# Patient Record
Sex: Female | Born: 1950 | Race: White | Hispanic: No | Marital: Married | Smoking: Former smoker
Health system: Southern US, Community
[De-identification: ages and names within clinical notes are randomized; demographics above are authoritative.]

## PROBLEM LIST (undated history)

## (undated) DIAGNOSIS — E7800 Pure hypercholesterolemia, unspecified: Secondary | ICD-10-CM

## (undated) DIAGNOSIS — T4145XA Adverse effect of unspecified anesthetic, initial encounter: Secondary | ICD-10-CM

## (undated) DIAGNOSIS — T8859XA Other complications of anesthesia, initial encounter: Secondary | ICD-10-CM

## (undated) DIAGNOSIS — L719 Rosacea, unspecified: Secondary | ICD-10-CM

## (undated) DIAGNOSIS — R51 Headache: Secondary | ICD-10-CM

## (undated) DIAGNOSIS — I10 Essential (primary) hypertension: Secondary | ICD-10-CM

## (undated) DIAGNOSIS — K219 Gastro-esophageal reflux disease without esophagitis: Secondary | ICD-10-CM

## (undated) DIAGNOSIS — R519 Headache, unspecified: Secondary | ICD-10-CM

## (undated) HISTORY — PX: WISDOM TOOTH EXTRACTION: SHX21

## (undated) HISTORY — PX: ABDOMINAL HYSTERECTOMY: SHX81

## (undated) HISTORY — PX: COLONOSCOPY: SHX174

---
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
## 2005-01-15 ENCOUNTER — Ambulatory Visit: Payer: Self-pay | Admitting: Obstetrics and Gynecology

## 2006-01-17 ENCOUNTER — Ambulatory Visit: Payer: Self-pay | Admitting: Obstetrics and Gynecology

## 2007-01-20 ENCOUNTER — Ambulatory Visit: Payer: Self-pay | Admitting: Obstetrics and Gynecology

## 2007-02-11 ENCOUNTER — Ambulatory Visit: Payer: Self-pay | Admitting: Gastroenterology

## 2008-01-26 ENCOUNTER — Ambulatory Visit: Payer: Self-pay | Admitting: Obstetrics and Gynecology

## 2009-01-27 ENCOUNTER — Ambulatory Visit: Payer: Self-pay | Admitting: Obstetrics and Gynecology

## 2010-02-21 ENCOUNTER — Ambulatory Visit: Payer: Self-pay | Admitting: Obstetrics and Gynecology

## 2010-03-08 ENCOUNTER — Ambulatory Visit: Payer: Self-pay | Admitting: Obstetrics and Gynecology

## 2011-03-20 ENCOUNTER — Ambulatory Visit: Payer: Self-pay | Admitting: Obstetrics and Gynecology

## 2012-04-08 ENCOUNTER — Ambulatory Visit: Payer: Self-pay | Admitting: Obstetrics and Gynecology

## 2013-05-20 ENCOUNTER — Ambulatory Visit: Payer: Self-pay | Admitting: Obstetrics and Gynecology

## 2013-10-27 DIAGNOSIS — E559 Vitamin D deficiency, unspecified: Secondary | ICD-10-CM | POA: Insufficient documentation

## 2013-10-27 DIAGNOSIS — K219 Gastro-esophageal reflux disease without esophagitis: Secondary | ICD-10-CM | POA: Insufficient documentation

## 2014-01-11 DIAGNOSIS — E785 Hyperlipidemia, unspecified: Secondary | ICD-10-CM | POA: Insufficient documentation

## 2014-01-11 DIAGNOSIS — L719 Rosacea, unspecified: Secondary | ICD-10-CM | POA: Insufficient documentation

## 2014-01-11 DIAGNOSIS — I10 Essential (primary) hypertension: Secondary | ICD-10-CM | POA: Insufficient documentation

## 2014-01-11 DIAGNOSIS — G43909 Migraine, unspecified, not intractable, without status migrainosus: Secondary | ICD-10-CM | POA: Insufficient documentation

## 2014-06-02 ENCOUNTER — Ambulatory Visit: Payer: Self-pay | Admitting: Obstetrics and Gynecology

## 2014-06-21 ENCOUNTER — Ambulatory Visit: Payer: Self-pay | Admitting: Obstetrics and Gynecology

## 2015-06-09 ENCOUNTER — Other Ambulatory Visit: Payer: Self-pay | Admitting: Obstetrics and Gynecology

## 2015-06-09 DIAGNOSIS — Z1231 Encounter for screening mammogram for malignant neoplasm of breast: Secondary | ICD-10-CM

## 2015-06-14 ENCOUNTER — Ambulatory Visit
Admission: RE | Admit: 2015-06-14 | Discharge: 2015-06-14 | Disposition: A | Discharge status: Home / Self Care | Payer: BC Managed Care – PPO | Source: Ambulatory Visit | Attending: Obstetrics and Gynecology | Admitting: Obstetrics and Gynecology

## 2015-06-14 DIAGNOSIS — Z1231 Encounter for screening mammogram for malignant neoplasm of breast: Secondary | ICD-10-CM | POA: Diagnosis present

## 2015-06-17 ENCOUNTER — Other Ambulatory Visit: Payer: Self-pay | Admitting: Obstetrics and Gynecology

## 2015-06-17 DIAGNOSIS — N63 Unspecified lump in unspecified breast: Secondary | ICD-10-CM

## 2015-06-24 ENCOUNTER — Ambulatory Visit
Admission: RE | Admit: 2015-06-24 | Discharge: 2015-06-24 | Disposition: A | Discharge status: Home / Self Care | Payer: BC Managed Care – PPO | Source: Ambulatory Visit | Attending: Obstetrics and Gynecology | Admitting: Obstetrics and Gynecology

## 2015-06-24 DIAGNOSIS — R928 Other abnormal and inconclusive findings on diagnostic imaging of breast: Secondary | ICD-10-CM | POA: Diagnosis not present

## 2015-06-24 DIAGNOSIS — N63 Unspecified lump in unspecified breast: Secondary | ICD-10-CM

## 2015-08-06 ENCOUNTER — Encounter: Payer: Self-pay | Admitting: Emergency Medicine

## 2015-08-06 DIAGNOSIS — I10 Essential (primary) hypertension: Secondary | ICD-10-CM | POA: Insufficient documentation

## 2015-08-06 DIAGNOSIS — R109 Unspecified abdominal pain: Secondary | ICD-10-CM | POA: Insufficient documentation

## 2015-08-06 DIAGNOSIS — R112 Nausea with vomiting, unspecified: Secondary | ICD-10-CM | POA: Diagnosis not present

## 2015-08-06 NOTE — ED Notes (Addendum)
Pt presents to ED with flu-like symptoms of chills, body aches, headache for the past week. Pt states she started to feel a little better on Friday but today worsened today with headache, vomiting X2 and "bladder pain". Pt states she has been trying to drink fluids all day but is not improving. Pt alert and calm at this time with no increased work of breathing or acute distress noted. Last time vomiting was around 2200.

## 2015-08-07 ENCOUNTER — Emergency Department
Admission: EM | Admit: 2015-08-07 | Discharge: 2015-08-07 | Disposition: A | Discharge status: Home / Self Care | Payer: BC Managed Care – PPO | Attending: Emergency Medicine | Admitting: Emergency Medicine

## 2015-08-07 DIAGNOSIS — R112 Nausea with vomiting, unspecified: Secondary | ICD-10-CM

## 2015-08-07 HISTORY — DX: Essential (primary) hypertension: I10

## 2015-08-07 LAB — COMPREHENSIVE METABOLIC PANEL
ALT: 23 U/L (ref 14–54)
AST: 27 U/L (ref 15–41)
Albumin: 4.1 g/dL (ref 3.5–5.0)
Alkaline Phosphatase: 95 U/L (ref 38–126)
Anion gap: 6 (ref 5–15)
BILIRUBIN TOTAL: 0.5 mg/dL (ref 0.3–1.2)
BUN: 12 mg/dL (ref 6–20)
CHLORIDE: 102 mmol/L (ref 101–111)
CO2: 30 mmol/L (ref 22–32)
Calcium: 9.3 mg/dL (ref 8.9–10.3)
Creatinine, Ser: 0.67 mg/dL (ref 0.44–1.00)
Glucose, Bld: 131 mg/dL — ABNORMAL HIGH (ref 65–99)
POTASSIUM: 4.2 mmol/L (ref 3.5–5.1)
Sodium: 138 mmol/L (ref 135–145)
TOTAL PROTEIN: 7.7 g/dL (ref 6.5–8.1)

## 2015-08-07 LAB — URINALYSIS COMPLETE WITH MICROSCOPIC (ARMC ONLY)
Bilirubin Urine: NEGATIVE
GLUCOSE, UA: NEGATIVE mg/dL
HGB URINE DIPSTICK: NEGATIVE
LEUKOCYTES UA: NEGATIVE
NITRITE: NEGATIVE
Protein, ur: NEGATIVE mg/dL
SPECIFIC GRAVITY, URINE: 1.017 (ref 1.005–1.030)
pH: 5 (ref 5.0–8.0)

## 2015-08-07 LAB — CBC
HEMATOCRIT: 42.8 % (ref 35.0–47.0)
Hemoglobin: 14.5 g/dL (ref 12.0–16.0)
MCH: 29.5 pg (ref 26.0–34.0)
MCHC: 33.9 g/dL (ref 32.0–36.0)
MCV: 87.2 fL (ref 80.0–100.0)
PLATELETS: 230 10*3/uL (ref 150–440)
RBC: 4.91 MIL/uL (ref 3.80–5.20)
RDW: 13.7 % (ref 11.5–14.5)
WBC: 3.3 10*3/uL — AB (ref 3.6–11.0)

## 2015-08-07 MED ORDER — ONDANSETRON HCL 4 MG PO TABS: 4.0000 mg | ORAL_TABLET | Freq: Every day | ORAL | Status: DC | PRN

## 2015-08-07 NOTE — Discharge Instructions (Signed)
Please seek medical attention for any high fevers, chest pain, shortness of breath, change in behavior, persistent vomiting, bloody stool or any other new or concerning symptoms. ° ° °Nausea and Vomiting °Nausea is a sick feeling that often comes before throwing up (vomiting). Vomiting is a reflex where stomach contents come out of your mouth. Vomiting can cause severe loss of body fluids (dehydration). Children and elderly adults can become dehydrated quickly, especially if they also have diarrhea. Nausea and vomiting are symptoms of a condition or disease. It is important to find the cause of your symptoms. °CAUSES  °· Direct irritation of the stomach lining. This irritation can result from increased acid production (gastroesophageal reflux disease), infection, food poisoning, taking certain medicines (such as nonsteroidal anti-inflammatory drugs), alcohol use, or tobacco use. °· Signals from the brain. These signals could be caused by a headache, heat exposure, an inner ear disturbance, increased pressure in the brain from injury, infection, a tumor, or a concussion, pain, emotional stimulus, or metabolic problems. °· An obstruction in the gastrointestinal tract (bowel obstruction). °· Illnesses such as diabetes, hepatitis, gallbladder problems, appendicitis, kidney problems, cancer, sepsis, atypical symptoms of a heart attack, or eating disorders. °· Medical treatments such as chemotherapy and radiation. °· Receiving medicine that makes you sleep (general anesthetic) during surgery. °DIAGNOSIS °Your caregiver may ask for tests to be done if the problems do not improve after a few days. Tests may also be done if symptoms are severe or if the reason for the nausea and vomiting is not clear. Tests may include: °· Urine tests. °· Blood tests. °· Stool tests. °· Cultures (to look for evidence of infection). °· X-rays or other imaging studies. °Test results can help your caregiver make decisions about treatment or the  need for additional tests. °TREATMENT °You need to stay well hydrated. Drink frequently but in small amounts. You may wish to drink water, sports drinks, clear broth, or eat frozen ice pops or gelatin dessert to help stay hydrated. When you eat, eating slowly may help prevent nausea. There are also some antinausea medicines that may help prevent nausea. °HOME CARE INSTRUCTIONS  °· Take all medicine as directed by your caregiver. °· If you do not have an appetite, do not force yourself to eat. However, you must continue to drink fluids. °· If you have an appetite, eat a normal diet unless your caregiver tells you differently. °¨ Eat a variety of complex carbohydrates (rice, wheat, potatoes, bread), lean meats, yogurt, fruits, and vegetables. °¨ Avoid high-fat foods because they are more difficult to digest. °· Drink enough water and fluids to keep your urine clear or pale yellow. °· If you are dehydrated, ask your caregiver for specific rehydration instructions. Signs of dehydration may include: °¨ Severe thirst. °¨ Dry lips and mouth. °¨ Dizziness. °¨ Dark urine. °¨ Decreasing urine frequency and amount. °¨ Confusion. °¨ Rapid breathing or pulse. °SEEK IMMEDIATE MEDICAL CARE IF:  °· You have blood or brown flecks (like coffee grounds) in your vomit. °· You have black or bloody stools. °· You have a severe headache or stiff neck. °· You are confused. °· You have severe abdominal pain. °· You have chest pain or trouble breathing. °· You do not urinate at least once every 8 hours. °· You develop cold or clammy skin. °· You continue to vomit for longer than 24 to 48 hours. °· You have a fever. °MAKE SURE YOU:  °· Understand these instructions. °· Will watch your condition. °· Will get help right away   if you are not doing well or get worse. °  °This information is not intended to replace advice given to you by your health care provider. Make sure you discuss any questions you have with your health care provider. °    °Document Released: 04/09/2005 Document Revised: 07/02/2011 Document Reviewed: 09/06/2010 °Elsevier Interactive Patient Education ©2016 [employer] ° °

## 2015-08-07 NOTE — ED Notes (Signed)
Pt reports flu-like symptoms times one week. Pt reports her husband had the same symptoms the week prior. Pt c/o of muscle aches, fever, productive cough, nausea/vomiting/diarrhea, fatigue.

## 2015-08-07 NOTE — ED Provider Notes (Signed)
[facility] Emergency Department Provider Note    ____________________________________________  Time seen: ~0525  I have reviewed the triage vital signs and the nursing notes.   HISTORY  Chief Complaint Generalized Body Aches; Chills; Headache; Emesis; and Abdominal Pain   History limited by: Not Limited   HPI Christina Jordan is a 65 y.o. female who presented to the emergency department today because of concerns for body aches, vomiting and suprapubic pressure. The patient states that she had a flulike illness last week. She then felt better at the end of the week. She states today however she started feeling worse again. She describes nausea and vomiting. She states she has not been able to eat or keep anything down. She states she has had some associated diarrhea. She denies any blood in the vomit or diarrhea. In addition the patient states she has noticed suprapubic pressure. She denies any fevers today.    Past Medical History  Diagnosis Date  . Hypertension     There are no active problems to display for this patient.   Past Surgical History  Procedure Laterality Date  . Abdominal hysterectomy      No current outpatient prescriptions on file.  Allergies Review of patient's allergies indicates no known allergies.  Family History  Problem Relation Age of Onset  . Breast cancer Neg Hx     Social History Social History  Substance Use Topics  . Smoking status: Never Smoker   . Smokeless tobacco: None  . Alcohol Use: No    Review of Systems  Constitutional: Negative for fever. Cardiovascular: Negative for chest pain. Respiratory: Negative for shortness of breath. Gastrointestinal: Positive for suprapubic pain Neurological: Negative for headaches, focal weakness or numbness.   10-point ROS otherwise negative.  ____________________________________________   PHYSICAL EXAM:  VITAL SIGNS: ED Triage Vitals  Enc Vitals  Group     BP 08/06/15 2356 155/98 mmHg     Pulse Rate 08/06/15 2356 73     Resp 08/06/15 2356 18     Temp 08/06/15 2356 98.1 F (36.7 C)     Temp Source 08/06/15 2356 Oral     SpO2 08/06/15 2356 100 %     Weight 08/06/15 2356 190 lb (86.183 kg)     Height 08/06/15 2356 5\' 6"  (1.676 m)     Head Cir --      Peak Flow --      Pain Score 08/06/15 2358 4   Constitutional: Alert and oriented. Well appearing and in no distress. Eyes: Conjunctivae are normal. PERRL. Normal extraocular movements. ENT   Head: Normocephalic and atraumatic.   Nose: No congestion/rhinnorhea.   Mouth/Throat: Mucous membranes are moist.   Neck: No stridor. Hematological/Lymphatic/Immunilogical: No cervical lymphadenopathy. Cardiovascular: Normal rate, regular rhythm.  No murmurs, rubs, or gallops. Respiratory: Normal respiratory effort without tachypnea nor retractions. Breath sounds are clear and equal bilaterally. No wheezes/rales/rhonchi. Gastrointestinal: Soft and nontender. No distention.  Genitourinary: Deferred Musculoskeletal: Normal range of motion in all extremities. No joint effusions.  No lower extremity tenderness nor edema. Neurologic:  Normal speech and language. No gross focal neurologic deficits are appreciated.  Skin:  Skin is warm, dry and intact. No rash noted. Psychiatric: Mood and affect are normal. Speech and behavior are normal. Patient exhibits appropriate insight and judgment.  ____________________________________________    LABS (pertinent positives/negatives)  Labs Reviewed  COMPREHENSIVE METABOLIC PANEL - Abnormal; Notable for the following:    Glucose, Bld 131 (*)    All other  components within normal limits  CBC - Abnormal; Notable for the following:    WBC 3.3 (*)    All other components within normal limits  URINALYSIS COMPLETEWITH MICROSCOPIC ([facility] ONLY) - Abnormal; Notable for the following:    Color, Urine YELLOW (*)    APPearance CLEAR (*)    Ketones,  ur TRACE (*)    Bacteria, UA RARE (*)    Squamous Epithelial / LPF 0-5 (*)    All other components within normal limits     ____________________________________________   EKG  None  ____________________________________________    RADIOLOGY  None  ____________________________________________   PROCEDURES  Procedure(s) performed: None  Critical Care performed: No  ____________________________________________   INITIAL IMPRESSION / ASSESSMENT AND PLAN / ED COURSE  Pertinent labs & imaging results that were available during my care of the patient were reviewed by me and considered in my medical decision making (see chart for details).  Patient presented to the emergency department today with multiple viral type syndrome. Urine without any signs of infection. Blood work without any concerning findings. Discussed with patient and offered IV fluids. She however declined. Will discharge with antiemetics. Encouraged primary care follow-up  ____________________________________________   FINAL CLINICAL IMPRESSION(S) / ED DIAGNOSES  Final diagnoses:  Non-intractable vomiting with nausea, vomiting of unspecified type     Phineas Semen, MD 08/07/15 [phone]

## 2015-08-07 NOTE — ED Notes (Signed)
  Reviewed d/c instructions, follow-up care, and prescriptions with pt. Pt verbalized understanding 

## 2016-06-07 ENCOUNTER — Other Ambulatory Visit: Payer: Self-pay | Admitting: Obstetrics and Gynecology

## 2016-06-07 DIAGNOSIS — Z1231 Encounter for screening mammogram for malignant neoplasm of breast: Secondary | ICD-10-CM

## 2016-06-26 ENCOUNTER — Ambulatory Visit
Admission: RE | Admit: 2016-06-26 | Discharge: 2016-06-26 | Disposition: A | Discharge status: Home / Self Care | Payer: BC Managed Care – PPO | Source: Ambulatory Visit | Attending: Obstetrics and Gynecology | Admitting: Obstetrics and Gynecology

## 2016-06-26 ENCOUNTER — Other Ambulatory Visit: Payer: Self-pay | Admitting: Obstetrics and Gynecology

## 2016-06-26 DIAGNOSIS — Z1231 Encounter for screening mammogram for malignant neoplasm of breast: Secondary | ICD-10-CM | POA: Insufficient documentation

## 2016-12-29 ENCOUNTER — Ambulatory Visit
Admission: EM | Admit: 2016-12-29 | Discharge: 2016-12-29 | Disposition: A | Discharge status: Home / Self Care | Payer: BC Managed Care – PPO | Attending: Family Medicine | Admitting: Family Medicine

## 2016-12-29 DIAGNOSIS — J0100 Acute maxillary sinusitis, unspecified: Secondary | ICD-10-CM

## 2016-12-29 DIAGNOSIS — L739 Follicular disorder, unspecified: Secondary | ICD-10-CM | POA: Diagnosis not present

## 2016-12-29 HISTORY — DX: Pure hypercholesterolemia, unspecified: E78.00

## 2016-12-29 MED ORDER — MUPIROCIN 2 % EX OINT: 1.0000 "application " | TOPICAL_OINTMENT | Freq: Two times a day (BID) | CUTANEOUS | 0 refills | Status: DC

## 2016-12-29 MED ORDER — AMOXICILLIN-POT CLAVULANATE 875-125 MG PO TABS: 1.0000 | ORAL_TABLET | Freq: Two times a day (BID) | ORAL | 0 refills | Status: DC

## 2016-12-29 MED ORDER — FEXOFENADINE-PSEUDOEPHED ER 180-240 MG PO TB24: 1.0000 | ORAL_TABLET | Freq: Every day | ORAL | 0 refills | Status: DC

## 2016-12-29 NOTE — ED Provider Notes (Signed)
[facility]    CSN: [id number] Arrival date & time: 12/29/16  1025     History   Chief Complaint Chief Complaint  Patient presents with  . Facial Pain    HPI Christina Jordan is a 66 y.o. female.   Patient is here because of the right sinus congestion she also reports along with right sinus congestion pain over her right ear. She reports symptoms started on Monday as well as nasal congestion last night she started having more swelling around the right ear she did think she may have scratched herself Friday near the mandible curve and the skin is broken this swelling underneath the right ear as well and because of the swelling in the right ear her daughter insisted she come in to be seen. She does have a history of recurrent sinus infection. She also has history of elevated cholesterol and hypertension. Surgeries been abdominal hysterectomy. Habits she does not smoke anymore she stopped in 1999. No pertinent family medical history relevant to today's visit. There is a history of breast cancer family   The history is provided by the patient. No language interpreter was used.    Past Medical History:  Diagnosis Date  . High cholesterol   . Hypertension     There are no active problems to display for this patient.   Past Surgical History:  Procedure Laterality Date  . ABDOMINAL HYSTERECTOMY      OB History    No data available       Home Medications    Prior to Admission medications   Medication Sig Start Date End Date Taking? Authorizing Provider  atorvastatin (LIPITOR) 10 MG tablet Take 10 mg by mouth daily.   Yes [provider]  benazepril-hydrochlorthiazide (LOTENSIN HCT) 10-12.5 MG tablet Take 1 tablet by mouth daily.   Yes [provider]  lisinopril (PRINIVIL,ZESTRIL) 10 MG tablet Take 10 mg by mouth daily.   Yes [provider]  amoxicillin-clavulanate (AUGMENTIN) 875-125 MG tablet Take 1 tablet by mouth 2 (two) times  daily. 12/29/16   Hassan Rowan, MD  fexofenadine-pseudoephedrine (ALLEGRA-D ALLERGY & CONGESTION) 180-240 MG 24 hr tablet Take 1 tablet by mouth daily. 12/29/16   Hassan Rowan, MD  mupirocin ointment (BACTROBAN) 2 % Apply 1 application topically 2 (two) times daily. 12/29/16   Hassan Rowan, MD  ondansetron (ZOFRAN) 4 MG tablet Take 1 tablet (4 mg total) by mouth daily as needed for nausea or vomiting. 08/07/15   Phineas Semen, MD    Family History Family History  Problem Relation Age of Onset  . Breast cancer Neg Hx     Social History Social History  Substance Use Topics  . Smoking status: Former Smoker    Types: Cigarettes    Quit date: 05/02/1997  . Smokeless tobacco: Never Used  . Alcohol use No     Allergies   Patient has no known allergies.   Review of Systems Review of Systems  HENT: Positive for dental problem, facial swelling, sinus pain and sinus pressure. Negative for sore throat.   All other systems reviewed and are negative.    Physical Exam Triage Vital Signs ED Triage Vitals [12/29/16 1034]  Enc Vitals Group     BP      Pulse      Resp      Temp      Temp src      SpO2      Weight 205 lb (93 kg)  Height 5' 6.5" (1.689 m)     Head Circumference      Peak Flow      Pain Score 3     Pain Loc      Pain Edu?      Excl. in GC?    No data found.   Updated Vital Signs Ht 5' 6.5" (1.689 m)   Wt 205 lb (93 kg)   BMI 32.59 kg/m   Visual Acuity Right Eye Distance:   Left Eye Distance:   Bilateral Distance:    Right Eye Near:   Left Eye Near:    Bilateral Near:     Physical Exam  Constitutional: She is oriented to person, place, and time. She appears well-developed and well-nourished.  HENT:  Head: Normocephalic and atraumatic.    Right Ear: Hearing, tympanic membrane and ear canal normal.  Left Ear: Hearing, tympanic membrane, external ear and ear canal normal.  Ears:  Nose: Mucosal edema present. Right sinus exhibits maxillary sinus  tenderness. Right sinus exhibits no frontal sinus tenderness. Left sinus exhibits no maxillary sinus tenderness and no frontal sinus tenderness.  Mouth/Throat: Uvula is midline, oropharynx is clear and moist and mucous membranes are normal. No uvula swelling.    She has appears be a folliculitis 2 lesions in the left ear but is also swelling of the left gland in that area of outside by the ear and inside the mouth. There is tenderness over the right maxillary sinuses as well. Neck supple or marked right lymph nodes in the right  Eyes: Pupils are equal, round, and reactive to light. EOM are normal.  Neck: Normal range of motion. Neck supple.  Pulmonary/Chest: Effort normal.  Musculoskeletal: Normal range of motion.  Neurological: She is alert and oriented to person, place, and time. No cranial nerve deficit. Coordination normal.  Skin: Skin is warm. Rash noted. There is erythema.  Psychiatric: She has a normal mood and affect.  Vitals reviewed.    UC Treatments / Results  Labs (all labs ordered are listed, but only abnormal results are displayed) Labs Reviewed - No data to display  EKG  EKG Interpretation None       Radiology No results found.  Procedures Procedures (including critical care time)  Medications Ordered in UC Medications - No data to display   Initial Impression / Assessment and Plan / UC Course  I have reviewed the triage vital signs and the nursing notes.  Pertinent labs & imaging results that were available during my care of the patient were reviewed by me and considered in my medical decision making (see chart for details).   will use Bactroban for the folliculitis to cover for staph in case staff is present will place her on Augmentin 875 one tablet twice a day for the sinus infection and the Allegra-D 1 tablet a day for the nasal congestion follow-up PCP 1-2 weeks needed.  Final Clinical Impressions(s) / UC Diagnoses   Final diagnoses:  Acute  maxillary sinusitis, recurrence not specified  Folliculitis    New Prescriptions Discharge Medication List as of 12/29/2016 12:10 PM    START taking these medications   Details  amoxicillin-clavulanate (AUGMENTIN) 875-125 MG tablet Take 1 tablet by mouth 2 (two) times daily., Starting Sat 12/29/2016, Normal    fexofenadine-pseudoephedrine (ALLEGRA-D ALLERGY & CONGESTION) 180-240 MG 24 hr tablet Take 1 tablet by mouth daily., Starting Sat 12/29/2016, Normal    mupirocin ointment (BACTROBAN) 2 % Apply 1 application topically 2 (two) times daily.,  Starting Sat 12/29/2016, Normal       Note: This dictation was prepared with Dragon dictation along with smaller phrase technology. Any transcriptional errors that result from this process are unintentional. Controlled Substance Prescriptions [place] Controlled Substance Registry consulted? Not Applicable   Hassan Rowan, MD 12/29/16 1222

## 2016-12-29 NOTE — ED Triage Notes (Signed)
Pt with sinus congestion and teeth/facial pain the past few days. Right neck slightly swollen and area below her right ear scab where she was scratching. Drainage from right ear this a.m. Pain 3/10

## 2017-06-20 ENCOUNTER — Other Ambulatory Visit: Payer: Self-pay | Admitting: Obstetrics and Gynecology

## 2017-06-20 DIAGNOSIS — Z1239 Encounter for other screening for malignant neoplasm of breast: Secondary | ICD-10-CM

## 2017-06-27 ENCOUNTER — Ambulatory Visit
Admission: RE | Admit: 2017-06-27 | Discharge: 2017-06-27 | Disposition: A | Discharge status: Home / Self Care | Payer: Medicare Other | Source: Ambulatory Visit | Attending: Obstetrics and Gynecology | Admitting: Obstetrics and Gynecology

## 2017-06-27 ENCOUNTER — Other Ambulatory Visit: Payer: Self-pay | Admitting: Obstetrics and Gynecology

## 2017-06-27 DIAGNOSIS — Z1239 Encounter for other screening for malignant neoplasm of breast: Secondary | ICD-10-CM

## 2017-06-27 DIAGNOSIS — Z1231 Encounter for screening mammogram for malignant neoplasm of breast: Secondary | ICD-10-CM | POA: Diagnosis not present

## 2017-07-15 ENCOUNTER — Other Ambulatory Visit: Payer: Self-pay

## 2017-07-15 ENCOUNTER — Ambulatory Visit
Admission: EM | Admit: 2017-07-15 | Discharge: 2017-07-15 | Disposition: A | Discharge status: Home / Self Care | Payer: Medicare Other | Attending: Family Medicine | Admitting: Family Medicine

## 2017-07-15 ENCOUNTER — Encounter: Payer: Self-pay | Admitting: Emergency Medicine

## 2017-07-15 DIAGNOSIS — J209 Acute bronchitis, unspecified: Secondary | ICD-10-CM | POA: Diagnosis not present

## 2017-07-15 MED ORDER — ALBUTEROL SULFATE HFA 108 (90 BASE) MCG/ACT IN AERS: 1.0000 | INHALATION_SPRAY | Freq: Four times a day (QID) | RESPIRATORY_TRACT | 0 refills | Status: DC | PRN

## 2017-07-15 MED ORDER — DOXYCYCLINE HYCLATE 100 MG PO CAPS: 100.0000 mg | ORAL_CAPSULE | Freq: Two times a day (BID) | ORAL | 0 refills | Status: DC

## 2017-07-15 MED ORDER — PREDNISONE 50 MG PO TABS: ORAL_TABLET | ORAL | 0 refills | Status: DC

## 2017-07-15 NOTE — ED Triage Notes (Signed)
Patient c/o cough, chest congestion for the past 3 weeks.

## 2017-07-15 NOTE — ED Provider Notes (Signed)
[facility]    CSN: [id number] Arrival date & time: 07/15/17  1129  History   Chief Complaint Chief Complaint  Patient presents with  . Cough   HPI  67 year old female presents with cough.  Patient reports a 3-week history of cough, and chest congestion.  She states that her cough is productive of discolored sputum.  She is also had some nasal congestion as well.  She is been taking NyQuil without improvement.  No recent fever.  No chills.  No shortness of breath.  She is a former smoker.  No known exacerbating factors.  No other associated symptoms.  No other complaints at this time.  Past Medical History:  Diagnosis Date  . High cholesterol   . Hypertension    Past Surgical History:  Procedure Laterality Date  . ABDOMINAL HYSTERECTOMY     OB History   None    Home Medications    Prior to Admission medications   Medication Sig Start Date End Date Taking? Authorizing Provider  atorvastatin (LIPITOR) 10 MG tablet Take 10 mg by mouth daily.   Yes [provider]  lisinopril (PRINIVIL,ZESTRIL) 10 MG tablet Take 10 mg by mouth daily.   Yes [provider]  albuterol (PROVENTIL HFA;VENTOLIN HFA) 108 (90 Base) MCG/ACT inhaler Inhale 1-2 puffs into the lungs every 6 (six) hours as needed for wheezing or shortness of breath. 07/15/17   Everlene Otherook, Zarina Pe G, DO  benazepril-hydrochlorthiazide (LOTENSIN HCT) 10-12.5 MG tablet Take 1 tablet by mouth daily.    [provider]  doxycycline (VIBRAMYCIN) 100 MG capsule Take 1 capsule (100 mg total) by mouth 2 (two) times daily. 07/15/17   Tommie Samsook, Kaysen Sefcik G, DO  fexofenadine-pseudoephedrine (ALLEGRA-D ALLERGY & CONGESTION) 180-240 MG 24 hr tablet Take 1 tablet by mouth daily. 12/29/16   Hassan RowanWade, Eugene, MD  mupirocin ointment (BACTROBAN) 2 % Apply 1 application topically 2 (two) times daily. 12/29/16   Hassan RowanWade, Eugene, MD  ondansetron (ZOFRAN) 4 MG tablet Take 1 tablet (4 mg total) by mouth daily as needed for nausea or  vomiting. 08/07/15   Phineas SemenGoodman, Graydon, MD  predniSONE (DELTASONE) 50 MG tablet 1 tablet daily x 5 days 07/15/17   Tommie Samsook, Fionna Merriott G, DO   Family History Family History  Problem Relation Age of Onset  . Breast cancer Neg Hx    Social History Social History   Tobacco Use  . Smoking status: Former Smoker    Types: Cigarettes    Last attempt to quit: 05/02/1997    Years since quitting: 20.2  . Smokeless tobacco: Never Used  Substance Use Topics  . Alcohol use: No  . Drug use: No   Allergies   Patient has no known allergies.   Review of Systems Review of Systems  Constitutional: Negative for fever.  HENT: Positive for congestion.   Respiratory: Positive for cough. Negative for shortness of breath.    Physical Exam Triage Vital Signs ED Triage Vitals  Enc Vitals Group     BP 07/15/17 1202 139/84     Pulse Rate 07/15/17 1202 81     Resp 07/15/17 1202 16     Temp 07/15/17 1202 98.8 F (37.1 C)     Temp Source 07/15/17 1202 Oral     SpO2 07/15/17 1202 100 %     Weight 07/15/17 1200 200 lb (90.7 kg)     Height 07/15/17 1200 5\' 7"  (1.702 m)     Head Circumference --      Peak  Flow --      Pain Score 07/15/17 1200 0     Pain Loc --      Pain Edu? --      Excl. in GC? --    Updated Vital Signs BP 139/84 (BP Location: Left Arm)   Pulse 81   Temp 98.8 F (37.1 C) (Oral)   Resp 16   Ht 5\' 7"  (1.702 m)   Wt 200 lb (90.7 kg)   SpO2 100%   BMI 31.32 kg/m   Physical Exam  Constitutional: She is oriented to person, place, and time. She appears well-developed. No distress.  HENT:  Head: Normocephalic and atraumatic.  Mouth/Throat: Oropharynx is clear and moist.  Cardiovascular: Normal rate and regular rhythm.  Pulmonary/Chest: Effort normal.  Diffuse coarse breath sounds and expiratory wheezing.  Neurological: She is alert and oriented to person, place, and time.  Skin: Skin is warm. No rash noted.  Psychiatric: She has a normal mood and affect. Her behavior is normal.    Nursing note and vitals reviewed.  UC Treatments / Results  Labs (all labs ordered are listed, but only abnormal results are displayed) Labs Reviewed - No data to display  EKG None Radiology No results found.  Procedures Procedures (including critical care time)  Medications Ordered in UC Medications - No data to display   Initial Impression / Assessment and Plan / UC Course  I have reviewed the triage vital signs and the nursing notes.  Pertinent labs & imaging results that were available during my care of the patient were reviewed by me and considered in my medical decision making (see chart for details).    67 year old female presents with acute bronchitis.  Treating with doxycycline, prednisone, albuterol.   Final Clinical Impressions(s) / UC Diagnoses   Final diagnoses:  Acute bronchitis, unspecified organism    ED Discharge Orders        Ordered    doxycycline (VIBRAMYCIN) 100 MG capsule  2 times daily     07/15/17 1311    predniSONE (DELTASONE) 50 MG tablet     07/15/17 1311    albuterol (PROVENTIL HFA;VENTOLIN HFA) 108 (90 Base) MCG/ACT inhaler  Every 6 hours PRN     07/15/17 1311     Controlled Substance Prescriptions [place] Controlled Substance Registry consulted? Not Applicable   Tommie Sams, DO 07/15/17 1320

## 2017-09-19 ENCOUNTER — Encounter: Payer: Self-pay | Admitting: *Deleted

## 2017-09-20 ENCOUNTER — Ambulatory Visit: Payer: Medicare Other | Admitting: Anesthesiology

## 2017-09-20 ENCOUNTER — Ambulatory Visit
Admission: RE | Admit: 2017-09-20 | Discharge: 2017-09-20 | Disposition: A | Discharge status: Home / Self Care | Payer: Medicare Other | Source: Ambulatory Visit | Attending: Unknown Physician Specialty | Admitting: Unknown Physician Specialty

## 2017-09-20 ENCOUNTER — Encounter
Admission: RE | Disposition: A | Discharge status: Home / Self Care | Payer: Self-pay | Source: Ambulatory Visit | Attending: Unknown Physician Specialty

## 2017-09-20 ENCOUNTER — Other Ambulatory Visit: Payer: Self-pay

## 2017-09-20 ENCOUNTER — Encounter: Payer: Self-pay | Admitting: *Deleted

## 2017-09-20 DIAGNOSIS — K640 First degree hemorrhoids: Secondary | ICD-10-CM | POA: Diagnosis not present

## 2017-09-20 DIAGNOSIS — Z87891 Personal history of nicotine dependence: Secondary | ICD-10-CM | POA: Insufficient documentation

## 2017-09-20 DIAGNOSIS — Z1211 Encounter for screening for malignant neoplasm of colon: Secondary | ICD-10-CM | POA: Diagnosis present

## 2017-09-20 DIAGNOSIS — Z79899 Other long term (current) drug therapy: Secondary | ICD-10-CM | POA: Diagnosis not present

## 2017-09-20 DIAGNOSIS — K5730 Diverticulosis of large intestine without perforation or abscess without bleeding: Secondary | ICD-10-CM | POA: Diagnosis not present

## 2017-09-20 DIAGNOSIS — I10 Essential (primary) hypertension: Secondary | ICD-10-CM | POA: Diagnosis not present

## 2017-09-20 DIAGNOSIS — E7800 Pure hypercholesterolemia, unspecified: Secondary | ICD-10-CM | POA: Diagnosis not present

## 2017-09-20 DIAGNOSIS — K219 Gastro-esophageal reflux disease without esophagitis: Secondary | ICD-10-CM | POA: Insufficient documentation

## 2017-09-20 DIAGNOSIS — Z800 Family history of malignant neoplasm of digestive organs: Secondary | ICD-10-CM | POA: Diagnosis not present

## 2017-09-20 HISTORY — DX: Headache: R51

## 2017-09-20 HISTORY — DX: Headache, unspecified: R51.9

## 2017-09-20 HISTORY — DX: Rosacea, unspecified: L71.9

## 2017-09-20 HISTORY — DX: Gastro-esophageal reflux disease without esophagitis: K21.9

## 2017-09-20 HISTORY — PX: COLONOSCOPY WITH PROPOFOL: SHX5780

## 2017-09-20 SURGERY — COLONOSCOPY WITH PROPOFOL
Anesthesia: General

## 2017-09-20 MED ORDER — SODIUM CHLORIDE 0.9 % IV SOLN
INTRAVENOUS | Status: DC
  Administered 2017-09-20: 10:00:00 via INTRAVENOUS

## 2017-09-20 MED ORDER — FENTANYL CITRATE (PF) 100 MCG/2ML IJ SOLN
INTRAMUSCULAR | Status: AC
  Filled 2017-09-20: qty 2

## 2017-09-20 MED ORDER — MIDAZOLAM HCL 2 MG/2ML IJ SOLN
INTRAMUSCULAR | Status: AC
  Filled 2017-09-20: qty 2

## 2017-09-20 MED ORDER — LIDOCAINE HCL (PF) 2 % IJ SOLN
INTRAMUSCULAR | Status: DC | PRN
  Administered 2017-09-20: 60 mg

## 2017-09-20 MED ORDER — PROPOFOL 500 MG/50ML IV EMUL
INTRAVENOUS | Status: DC | PRN
  Administered 2017-09-20: 50 ug/kg/min via INTRAVENOUS

## 2017-09-20 MED ORDER — PROPOFOL 500 MG/50ML IV EMUL
INTRAVENOUS | Status: AC
  Filled 2017-09-20: qty 50

## 2017-09-20 MED ORDER — PROPOFOL 10 MG/ML IV BOLUS
INTRAVENOUS | Status: DC | PRN
  Administered 2017-09-20 (×2): 20 mg via INTRAVENOUS

## 2017-09-20 MED ORDER — MIDAZOLAM HCL 5 MG/5ML IJ SOLN
INTRAMUSCULAR | Status: DC | PRN
  Administered 2017-09-20: 2 mg via INTRAVENOUS

## 2017-09-20 MED ORDER — SODIUM CHLORIDE 0.9 % IV SOLN: INTRAVENOUS | Status: DC

## 2017-09-20 MED ORDER — LIDOCAINE HCL (PF) 2 % IJ SOLN
INTRAMUSCULAR | Status: AC
  Filled 2017-09-20: qty 10

## 2017-09-20 MED ORDER — FENTANYL CITRATE (PF) 100 MCG/2ML IJ SOLN
INTRAMUSCULAR | Status: DC | PRN
  Administered 2017-09-20 (×2): 50 ug via INTRAVENOUS

## 2017-09-20 NOTE — Anesthesia Preprocedure Evaluation (Signed)
Anesthesia Evaluation  Patient identified by MRN, date of birth, ID band Patient awake    Reviewed: Allergy & Precautions, H&P , NPO status , Patient's Chart, lab work & pertinent test results  History of Anesthesia Complications Negative for: history of anesthetic complications  Airway Mallampati: III  TM Distance: <3 FB Neck ROM: limited    Dental  (+) Chipped, Poor Dentition, Missing   Pulmonary asthma , former smoker,           Cardiovascular Exercise Tolerance: Good hypertension, (-) angina(-) Past MI and (-) DOE      Neuro/Psych  Headaches, negative psych ROS   GI/Hepatic Neg liver ROS, GERD  Medicated and Controlled,  Endo/Other  negative endocrine ROS  Renal/GU negative Renal ROS  negative genitourinary   Musculoskeletal   Abdominal   Peds  Hematology negative hematology ROS (+)   Anesthesia Other Findings Past Medical History: No date: GERD (gastroesophageal reflux disease) No date: Headache No date: High cholesterol No date: Hypertension No date: Rosacea  Past Surgical History: No date: ABDOMINAL HYSTERECTOMY No date: COLONOSCOPY No date: WISDOM TOOTH EXTRACTION  BMI    Body Mass Index:  31.32 kg/m      Reproductive/Obstetrics negative OB ROS                             Anesthesia Physical Anesthesia Plan  ASA: III  Anesthesia Plan: General   Post-op Pain Management:    Induction: Intravenous  PONV Risk Score and Plan: Propofol infusion and TIVA  Airway Management Planned: Natural Airway and Nasal Cannula  Additional Equipment:   Intra-op Plan:   Post-operative Plan:   Informed Consent: I have reviewed the patients History and Physical, chart, labs and discussed the procedure including the risks, benefits and alternatives for the proposed anesthesia with the patient or authorized representative who has indicated his/her understanding and acceptance.    Dental Advisory Given  Plan Discussed with: Anesthesiologist, CRNA and Surgeon  Anesthesia Plan Comments: (Patient consented for risks of anesthesia including but not limited to:  - adverse reactions to medications - risk of intubation if required - damage to teeth, lips or other oral mucosa - sore throat or hoarseness - Damage to heart, brain, lungs or loss of life  Patient voiced understanding.)        Anesthesia Quick Evaluation

## 2017-09-20 NOTE — Anesthesia Post-op Follow-up Note (Signed)
Anesthesia QCDR form completed.        

## 2017-09-20 NOTE — Anesthesia Postprocedure Evaluation (Signed)
Anesthesia Post Note  Patient: Christina Jordan  Procedure(s) Performed: COLONOSCOPY WITH PROPOFOL (N/A )  Patient location during evaluation: Endoscopy Anesthesia Type: General Level of consciousness: awake and alert Pain management: pain level controlled Vital Signs Assessment: post-procedure vital signs reviewed and stable Respiratory status: spontaneous breathing, nonlabored ventilation, respiratory function stable and patient connected to nasal cannula oxygen Cardiovascular status: blood pressure returned to baseline and stable Postop Assessment: no apparent nausea or vomiting Anesthetic complications: no     Last Vitals:  Vitals:   09/20/17 0924 09/20/17 1027  BP: 128/77 100/63  Pulse: 87   Resp: 18   Temp: (!) 36.2 C (!) 36.1 C  SpO2: 99%     Last Pain:  Vitals:   09/20/17 1027  TempSrc: Tympanic  PainSc:                  Cleda MccreedyJoseph K Sylvestre Rathgeber

## 2017-09-20 NOTE — Transfer of Care (Signed)
Immediate Anesthesia Transfer of Care Note  Patient: Christina Jordan  Procedure(s) Performed: COLONOSCOPY WITH PROPOFOL (N/A )  Patient Location: PACU  Anesthesia Type:General  Level of Consciousness: sedated  Airway & Oxygen Therapy: Patient Spontanous Breathing and Patient connected to nasal cannula oxygen  Post-op Assessment: Report given to RN and Post -op Vital signs reviewed and stable  Post vital signs: Reviewed and stable  Last Vitals:  Vitals Value Taken Time  BP 96/64 09/20/2017 10:35 AM  Temp 36.1 C 09/20/2017 10:27 AM  Pulse 74 09/20/2017 10:38 AM  Resp 13 09/20/2017 10:38 AM  SpO2 100 % 09/20/2017 10:38 AM  Vitals shown include unvalidated device data.  Last Pain:  Vitals:   09/20/17 1027  TempSrc: Tympanic  PainSc:          Complications: No apparent anesthesia complications

## 2017-09-20 NOTE — Op Note (Signed)
[facility] Patient Name: Christina Jordan Procedure Date: 09/20/2017 10:02 AM MRN: [id number] Account #: [id number] Date of Birth: 10/25/1950 Admit Type: Outpatient Age: 67 Room: [facility] ENDO ROOM 1 Gender: Female Note Status: Finalized Procedure:            Colonoscopy Indications:          Screening in patient at increased risk: Family history                        of 1st-degree relative with colorectal cancer Providers:            Scot Jun, MD Referring MD:         Marina Goodell (Referring MD) Medicines:            Propofol per Anesthesia Complications:        No immediate complications. Procedure:            Pre-Anesthesia Assessment:                       - After reviewing the risks and benefits, the patient                        was deemed in satisfactory condition to undergo the                        procedure.                       After obtaining informed consent, the colonoscope was                        passed under direct vision. Throughout the procedure,                        the patient's blood pressure, pulse, and oxygen                        saturations were monitored continuously. The                        Colonoscope was introduced through the anus and                        advanced to the the cecum, identified by appendiceal                        orifice and ileocecal valve. The colonoscopy was                        performed without difficulty. The patient tolerated the                        procedure well. The quality of the bowel preparation                        was excellent. Findings:      Many small-mouthed diverticula were found in the sigmoid colon.      Internal hemorrhoids were found during endoscopy. The hemorrhoids were       small and Grade I (internal hemorrhoids that do not prolapse).      The  exam was otherwise without abnormality. Impression:           - Diverticulosis in the sigmoid  colon.                       - Internal hemorrhoids.                       - The examination was otherwise normal.                       - No specimens collected. Recommendation:       - Repeat colonoscopy in 5 years for screening purposes. Scot Junobert T Elliott, MD 09/20/2017 10:25:22 AM This report has been signed electronically. Number of Addenda: 0 Note Initiated On: 09/20/2017 10:02 AM Scope Withdrawal Time: 0 hours 8 minutes 26 seconds  Total Procedure Duration: 0 hours 14 minutes 40 seconds       [facility]

## 2017-09-20 NOTE — H&P (Signed)
Primary Care Physician:  Marina Goodell, MD Primary Gastroenterologist:  Dr. Mechele Collin  Pre-Procedure History & Physical: HPI:  Christina Jordan is a 67 y.o. female is here for an colonoscopy.  Family history of colon cancer in her mother..   Past Medical History:  Diagnosis Date  . GERD (gastroesophageal reflux disease)   . Headache   . High cholesterol   . Hypertension   . Rosacea     Past Surgical History:  Procedure Laterality Date  . ABDOMINAL HYSTERECTOMY    . COLONOSCOPY    . WISDOM TOOTH EXTRACTION      Prior to Admission medications   Medication Sig Start Date End Date Taking? Authorizing Provider  atorvastatin (LIPITOR) 10 MG tablet Take 10 mg by mouth daily.   Yes [provider]  calcium-vitamin D (OSCAL WITH D) 500-200 MG-UNIT tablet Take 1 tablet by mouth daily.   Yes [provider]  Cyanocobalamin 2500 MCG CHEW Chew by mouth.   Yes [provider]  fexofenadine-pseudoephedrine (ALLEGRA-D ALLERGY & CONGESTION) 180-240 MG 24 hr tablet Take 1 tablet by mouth daily. 12/29/16  Yes Hassan Rowan, MD  hydrochlorothiazide (HYDRODIURIL) 25 MG tablet Take 25 mg by mouth daily.   Yes [provider]  lisinopril (PRINIVIL,ZESTRIL) 10 MG tablet Take 10 mg by mouth daily.   Yes [provider]  albuterol (PROVENTIL HFA;VENTOLIN HFA) 108 (90 Base) MCG/ACT inhaler Inhale 1-2 puffs into the lungs every 6 (six) hours as needed for wheezing or shortness of breath. Patient not taking: Reported on 09/20/2017 07/15/17   Tommie Sams, DO  doxycycline (VIBRAMYCIN) 100 MG capsule Take 1 capsule (100 mg total) by mouth 2 (two) times daily. Patient not taking: Reported on 09/20/2017 07/15/17   Tommie Sams, DO  mupirocin ointment (BACTROBAN) 2 % Apply 1 application topically 2 (two) times daily. Patient not taking: Reported on 09/20/2017 12/29/16   Hassan Rowan, MD  ondansetron (ZOFRAN) 4 MG tablet Take 1 tablet (4 mg total) by mouth daily as needed  for nausea or vomiting. Patient not taking: Reported on 09/20/2017 08/07/15   Phineas Semen, MD  predniSONE (DELTASONE) 50 MG tablet 1 tablet daily x 5 days Patient not taking: Reported on 09/20/2017 07/15/17   Tommie Sams, DO    Allergies as of 08/13/2017  . (No Known Allergies)    Family History  Problem Relation Age of Onset  . Breast cancer Neg Hx     Social History   Socioeconomic History  . Marital status: Married    Spouse name: Not on file  . Number of children: Not on file  . Years of education: Not on file  . Highest education level: Not on file  Occupational History  . Not on file  Social Needs  . Financial resource strain: Not on file  . Food insecurity:    Worry: Not on file    Inability: Not on file  . Transportation needs:    Medical: Not on file    Non-medical: Not on file  Tobacco Use  . Smoking status: Former Smoker    Types: Cigarettes    Last attempt to quit: 05/02/1997    Years since quitting: 20.4  . Smokeless tobacco: Never Used  Substance and Sexual Activity  . Alcohol use: No  . Drug use: No  . Sexual activity: Not on file  Lifestyle  . Physical activity:    Days per week: Not on file    Minutes per session: Not  on file  . Stress: Not on file  Relationships  . Social connections:    Talks on phone: Not on file    Gets together: Not on file    Attends religious service: Not on file    Active member of club or organization: Not on file    Attends meetings of clubs or organizations: Not on file    Relationship status: Not on file  . Intimate partner violence:    Fear of current or ex partner: Not on file    Emotionally abused: Not on file    Physically abused: Not on file    Forced sexual activity: Not on file  Other Topics Concern  . Not on file  Social History Narrative  . Not on file    Review of Systems: See HPI, otherwise negative ROS  Physical Exam: BP 128/77   Pulse 87   Temp (!) 97.2 F (36.2 C) (Tympanic)   Resp  18   Ht  (1.702 m)   Wt 90.7 kg (200 lb)   SpO2 99%   BMI 31.32 kg/m  General:   Alert,  pleasant and cooperative in NAD Head:  Normocephalic and atraumatic. Neck:  Supple; no masses or thyromegaly. Lungs:  Clear throughout to auscultation.    Heart:  Regular rate and rhythm. Abdomen:  Soft, nontender and nondistended. Normal bowel sounds, without guarding, and without rebound.   Neurologic:  Alert and  oriented x4;  grossly normal neurologically.  Impression/Plan: Christina Jordan is here for an colonoscopy to be performed for FH colon cancer in mother.  Risks, benefits, limitations, and alternatives regarding  colonoscopy have been reviewed with the patient.  Questions have been answered.  All parties agreeable.   Lynnae Prude, MD  09/20/2017, 10:02 AM

## 2017-09-23 ENCOUNTER — Encounter: Payer: Self-pay | Admitting: Unknown Physician Specialty

## 2018-01-18 IMAGING — MG MM DIAG BREAST TOMO UNI RIGHT
6 series · 6 of 14 positions shown · non-contrast
Comparison: Previous exam(s).

CLINICAL DATA: 64-year-old female for evaluation of possible right
breast asymmetry on screening mammogram.

EXAM:
DIGITAL DIAGNOSTIC RIGHT MAMMOGRAM WITH 3D TOMOSYNTHESIS AND CAD

[R CC synth-2D]
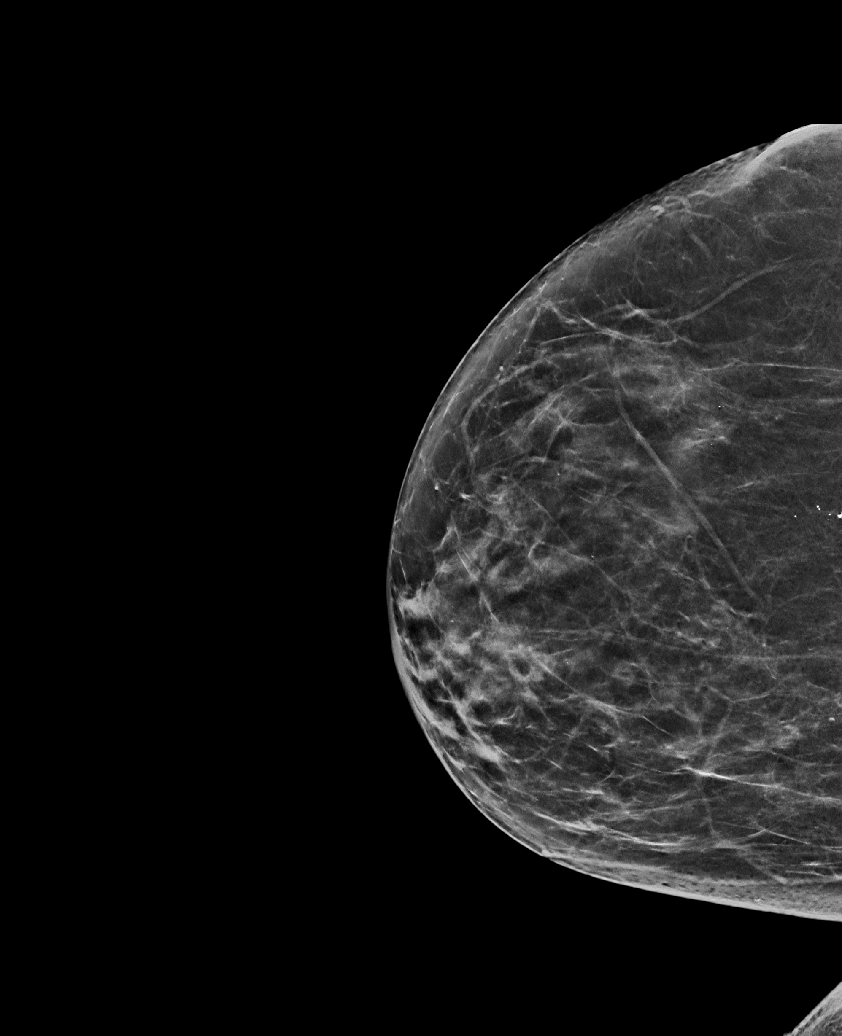

[R MLO synth-2D]
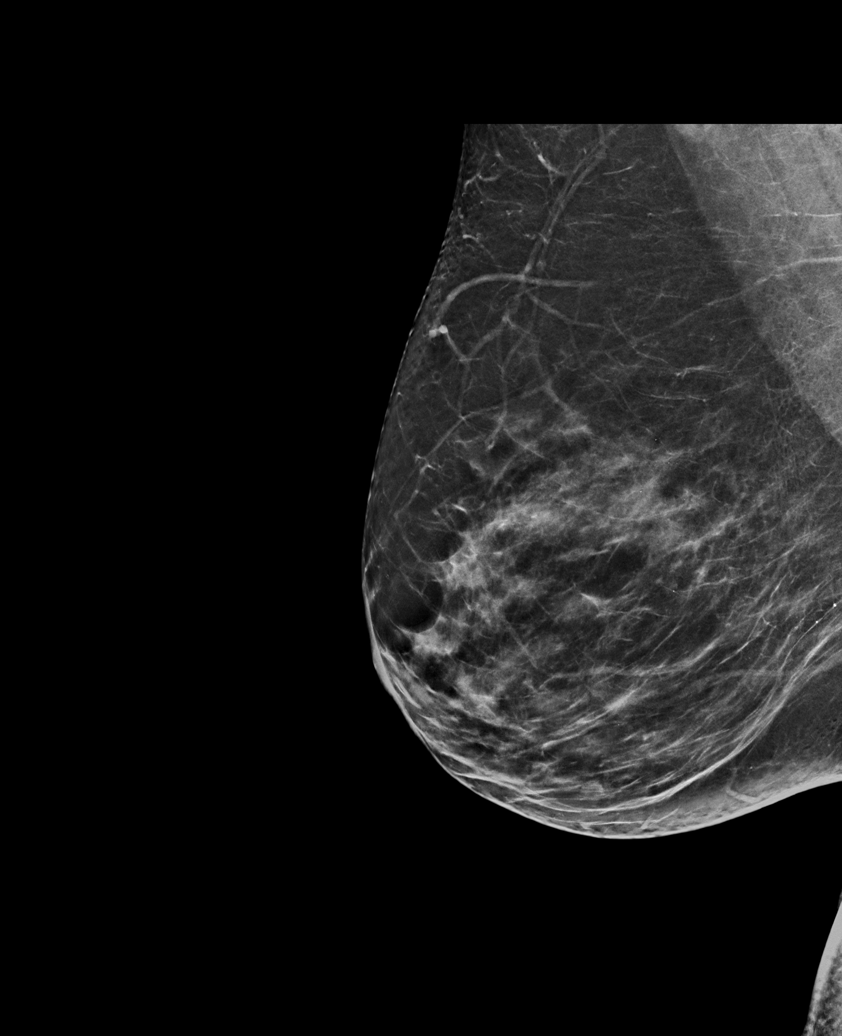

[R CC]
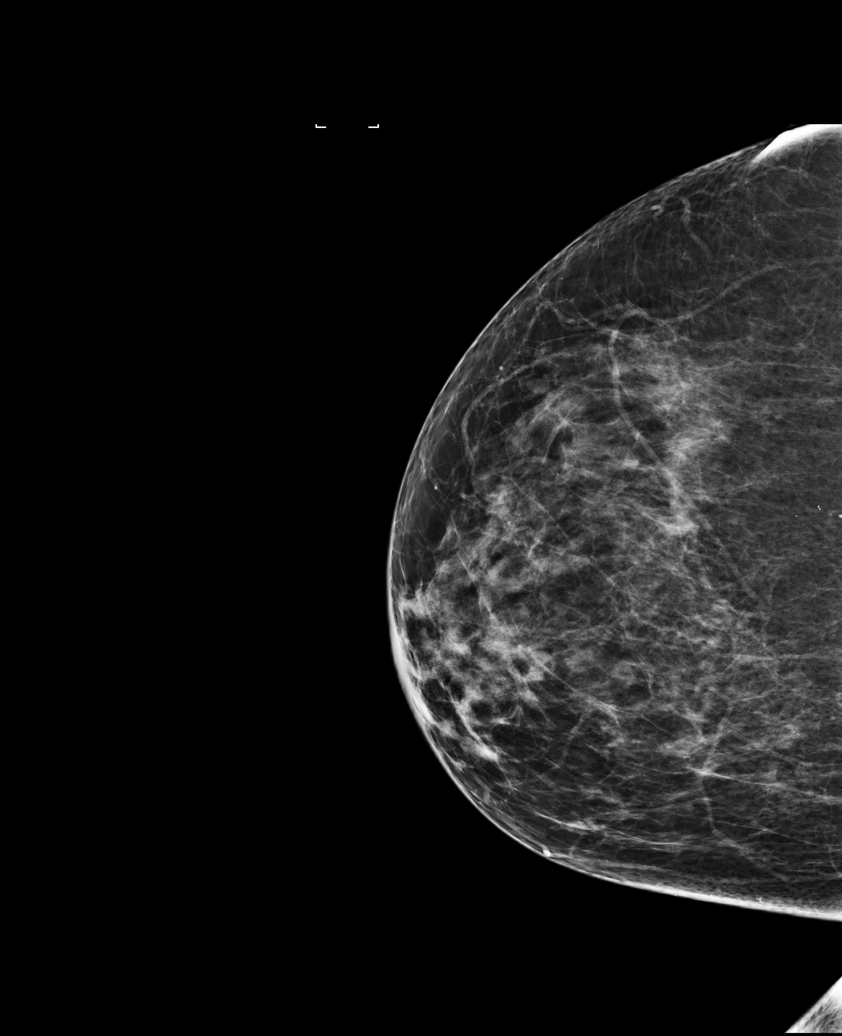

[R MLO]
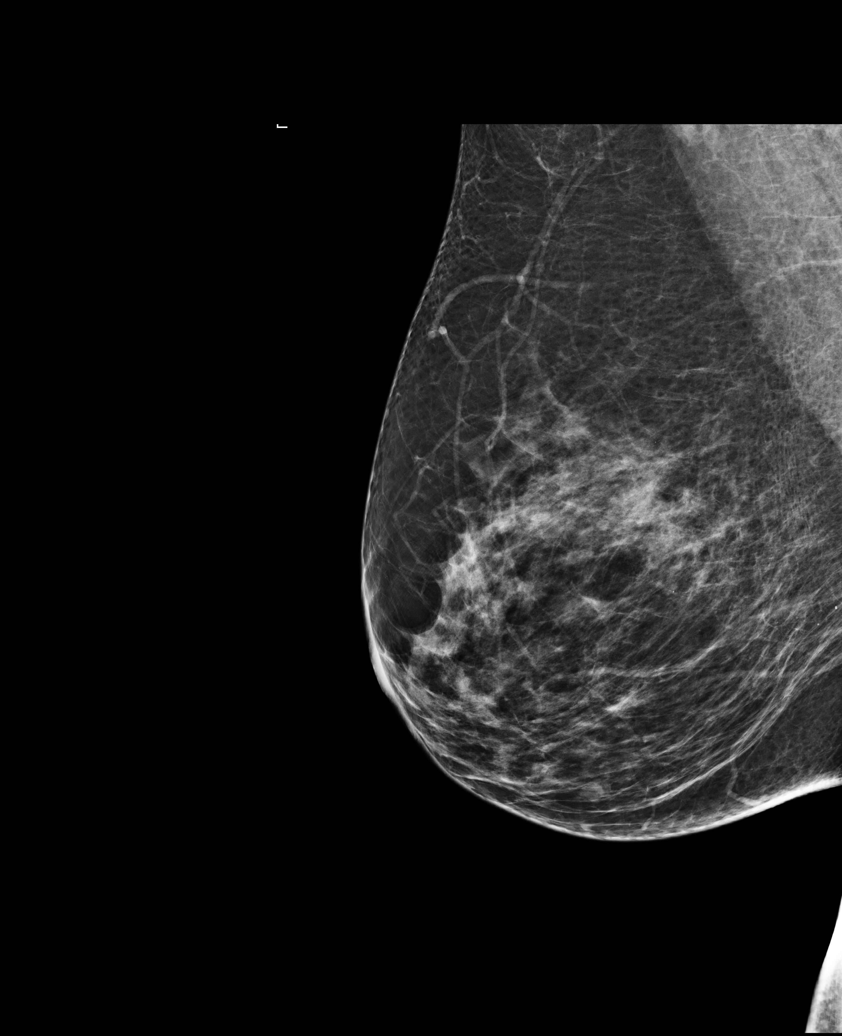

[R MLO tomo · tomo slice 39/78.0]
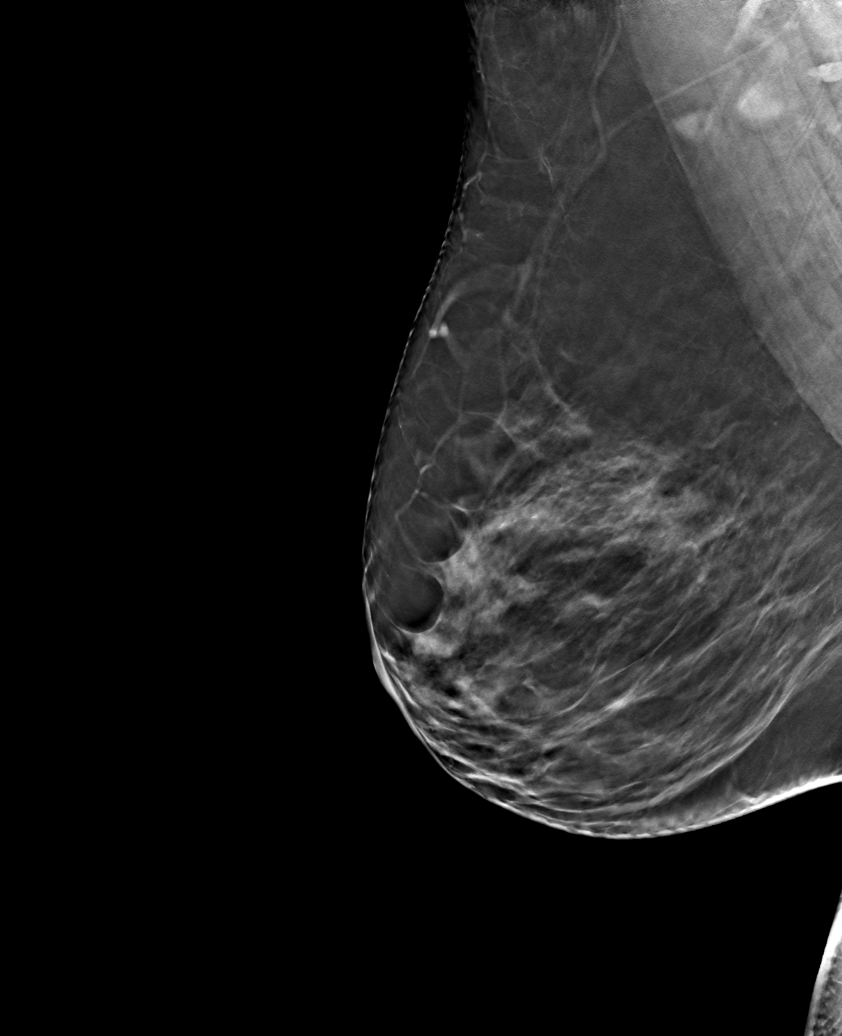

[R CC tomo · tomo slice 35/69.0]
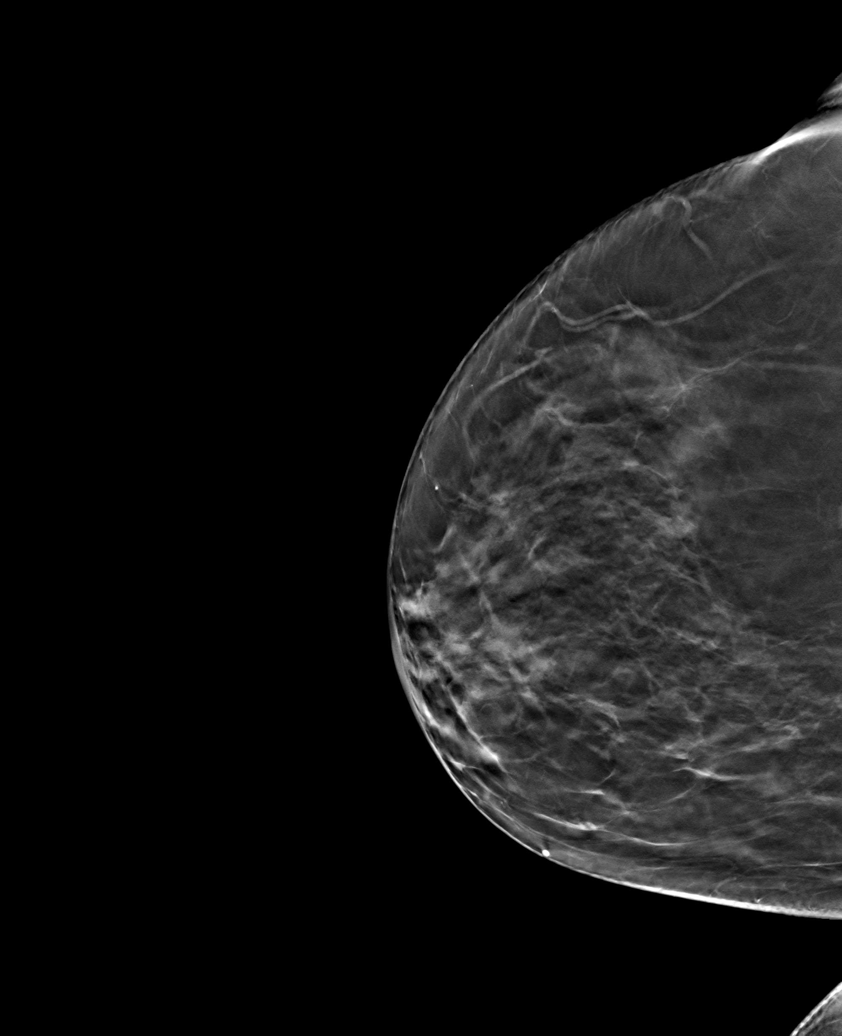

[6 of 14 positions shown; findings below may reference images not displayed]

ACR Breast Density Category b: There are scattered areas of
fibroglandular density.
FINDINGS: 2D and 3D full field views of the right breast demonstrate no
suspicious mass, distortion or worrisome calcifications. No
persistent abnormality is identified in the area of the screening
study finding.

Mammographic images were processed with CAD.
IMPRESSION: No persistent abnormality in the area of the screening study
finding.

RECOMMENDATION:
Bilateral screening mammograms in 1 year.

I have discussed the findings and recommendations with the patient.
Results were also provided in writing at the conclusion of the
visit. If applicable, a reminder letter will be sent to the patient
regarding the next appointment.

BI-RADS CATEGORY  1: Negative.

## 2018-06-27 ENCOUNTER — Other Ambulatory Visit: Payer: Self-pay | Admitting: Obstetrics and Gynecology

## 2018-06-27 DIAGNOSIS — Z1231 Encounter for screening mammogram for malignant neoplasm of breast: Secondary | ICD-10-CM

## 2018-07-08 ENCOUNTER — Encounter (INDEPENDENT_AMBULATORY_CARE_PROVIDER_SITE_OTHER): Payer: Self-pay

## 2018-07-08 ENCOUNTER — Other Ambulatory Visit: Payer: Self-pay

## 2018-07-08 ENCOUNTER — Ambulatory Visit
Admission: RE | Admit: 2018-07-08 | Discharge: 2018-07-08 | Disposition: A | Discharge status: Home / Self Care | Payer: Medicare Other | Source: Ambulatory Visit | Attending: Obstetrics and Gynecology | Admitting: Obstetrics and Gynecology

## 2018-07-08 DIAGNOSIS — Z1231 Encounter for screening mammogram for malignant neoplasm of breast: Secondary | ICD-10-CM | POA: Diagnosis not present

## 2018-09-01 ENCOUNTER — Ambulatory Visit: Admit: 2018-09-01 | Payer: Medicare Other | Admitting: Ophthalmology

## 2018-09-01 SURGERY — PHACOEMULSIFICATION, CATARACT, WITH IOL INSERTION
Anesthesia: Topical | Laterality: Right

## 2018-10-07 ENCOUNTER — Encounter: Payer: Self-pay | Admitting: *Deleted

## 2018-10-07 ENCOUNTER — Other Ambulatory Visit: Payer: Self-pay

## 2018-10-07 ENCOUNTER — Other Ambulatory Visit
Admission: RE | Admit: 2018-10-07 | Discharge: 2018-10-07 | Disposition: A | Discharge status: Home / Self Care | Payer: Medicare Other | Source: Ambulatory Visit | Attending: Ophthalmology | Admitting: Ophthalmology

## 2018-10-07 DIAGNOSIS — Z1159 Encounter for screening for other viral diseases: Secondary | ICD-10-CM | POA: Diagnosis present

## 2018-10-07 NOTE — Anesthesia Preprocedure Evaluation (Addendum)
Anesthesia Evaluation  Patient identified by MRN, date of birth, ID band Patient awake    Reviewed: Allergy & Precautions, NPO status , Patient's Chart, lab work & pertinent test results  History of Anesthesia Complications Negative for: history of anesthetic complications  Airway Mallampati: II   Neck ROM: Full    Dental  (+)    Pulmonary former smoker (quit 1999),    Pulmonary exam normal breath sounds clear to auscultation       Cardiovascular hypertension, Normal cardiovascular exam Rhythm:Regular Rate:Normal     Neuro/Psych  Headaches,    GI/Hepatic GERD  ,  Endo/Other  negative endocrine ROS  Renal/GU negative Renal ROS     Musculoskeletal   Abdominal   Peds  Hematology negative hematology ROS (+)   Anesthesia Other Findings   Reproductive/Obstetrics                            Anesthesia Physical Anesthesia Plan  ASA: II  Anesthesia Plan: MAC   Post-op Pain Management:    Induction: Intravenous  PONV Risk Score and Plan: 2 and TIVA and Midazolam  Airway Management Planned: Natural Airway  Additional Equipment:   Intra-op Plan:   Post-operative Plan:   Informed Consent: I have reviewed the patients History and Physical, chart, labs and discussed the procedure including the risks, benefits and alternatives for the proposed anesthesia with the patient or authorized representative who has indicated his/her understanding and acceptance.       Plan Discussed with: CRNA  Anesthesia Plan Comments:        Anesthesia Quick Evaluation

## 2018-10-08 LAB — NOVEL CORONAVIRUS, NAA (HOSP ORDER, SEND-OUT TO REF LAB; TAT 18-24 HRS): SARS-CoV-2, NAA: NOT DETECTED

## 2018-10-09 NOTE — Discharge Instructions (Signed)

## 2018-10-10 ENCOUNTER — Ambulatory Visit: Payer: Medicare Other | Admitting: Anesthesiology

## 2018-10-10 ENCOUNTER — Encounter
Admission: RE | Disposition: A | Discharge status: Home / Self Care | Payer: Self-pay | Source: Home / Self Care | Attending: Ophthalmology

## 2018-10-10 ENCOUNTER — Ambulatory Visit
Admission: RE | Admit: 2018-10-10 | Discharge: 2018-10-10 | Disposition: A | Discharge status: Home / Self Care | Payer: Medicare Other | Attending: Ophthalmology | Admitting: Ophthalmology

## 2018-10-10 DIAGNOSIS — Z885 Allergy status to narcotic agent status: Secondary | ICD-10-CM | POA: Insufficient documentation

## 2018-10-10 DIAGNOSIS — E7800 Pure hypercholesterolemia, unspecified: Secondary | ICD-10-CM | POA: Insufficient documentation

## 2018-10-10 DIAGNOSIS — I10 Essential (primary) hypertension: Secondary | ICD-10-CM | POA: Insufficient documentation

## 2018-10-10 DIAGNOSIS — Z90710 Acquired absence of both cervix and uterus: Secondary | ICD-10-CM | POA: Insufficient documentation

## 2018-10-10 DIAGNOSIS — H2511 Age-related nuclear cataract, right eye: Secondary | ICD-10-CM | POA: Diagnosis not present

## 2018-10-10 DIAGNOSIS — K219 Gastro-esophageal reflux disease without esophagitis: Secondary | ICD-10-CM | POA: Insufficient documentation

## 2018-10-10 DIAGNOSIS — R51 Headache: Secondary | ICD-10-CM | POA: Diagnosis not present

## 2018-10-10 DIAGNOSIS — Z87891 Personal history of nicotine dependence: Secondary | ICD-10-CM | POA: Insufficient documentation

## 2018-10-10 HISTORY — DX: Other complications of anesthesia, initial encounter: T88.59XA

## 2018-10-10 HISTORY — PX: CATARACT EXTRACTION W/PHACO: SHX586

## 2018-10-10 SURGERY — PHACOEMULSIFICATION, CATARACT, WITH IOL INSERTION
Anesthesia: Monitor Anesthesia Care | Site: Eye | Laterality: Right

## 2018-10-10 MED ORDER — LIDOCAINE HCL (PF) 2 % IJ SOLN
INTRAOCULAR | Status: DC | PRN
  Administered 2018-10-10: 1 mL via INTRAOCULAR

## 2018-10-10 MED ORDER — ARMC OPHTHALMIC DILATING DROPS
1.0000 "application " | OPHTHALMIC | Status: DC | PRN
  Administered 2018-10-10 (×3): 1 via OPHTHALMIC

## 2018-10-10 MED ORDER — MOXIFLOXACIN HCL 0.5 % OP SOLN
OPHTHALMIC | Status: DC | PRN
  Administered 2018-10-10: 0.2 mL via OPHTHALMIC

## 2018-10-10 MED ORDER — FENTANYL CITRATE (PF) 100 MCG/2ML IJ SOLN
INTRAMUSCULAR | Status: DC | PRN
  Administered 2018-10-10: 50 ug via INTRAVENOUS

## 2018-10-10 MED ORDER — ACETAMINOPHEN 325 MG PO TABS: 650.0000 mg | ORAL_TABLET | Freq: Once | ORAL | Status: DC | PRN

## 2018-10-10 MED ORDER — ONDANSETRON HCL 4 MG/2ML IJ SOLN: 4.0000 mg | Freq: Once | INTRAMUSCULAR | Status: DC | PRN

## 2018-10-10 MED ORDER — SODIUM HYALURONATE 23 MG/ML IO SOLN
INTRAOCULAR | Status: DC | PRN
  Administered 2018-10-10: 0.6 mL via INTRAOCULAR

## 2018-10-10 MED ORDER — EPINEPHRINE PF 1 MG/ML IJ SOLN
INTRAOCULAR | Status: DC | PRN
  Administered 2018-10-10: 15:00:00 53 mL via OPHTHALMIC

## 2018-10-10 MED ORDER — SODIUM HYALURONATE 10 MG/ML IO SOLN
INTRAOCULAR | Status: DC | PRN
  Administered 2018-10-10: 0.55 mL via INTRAOCULAR

## 2018-10-10 MED ORDER — ACETAMINOPHEN 160 MG/5ML PO SOLN: 325.0000 mg | ORAL | Status: DC | PRN

## 2018-10-10 MED ORDER — MIDAZOLAM HCL 2 MG/2ML IJ SOLN
INTRAMUSCULAR | Status: DC | PRN
  Administered 2018-10-10 (×2): 1 mg via INTRAVENOUS

## 2018-10-10 MED ORDER — TETRACAINE HCL 0.5 % OP SOLN
1.0000 [drp] | OPHTHALMIC | Status: DC | PRN
  Administered 2018-10-10 (×3): 1 [drp] via OPHTHALMIC

## 2018-10-10 SURGICAL SUPPLY — 17 items
CANNULA ANT/CHMB 27GA (MISCELLANEOUS) ×6 IMPLANT
DISSECTOR HYDRO NUCLEUS 50X22 (MISCELLANEOUS) ×3 IMPLANT
GLOVE SURG LX 7.5 STRW (GLOVE) ×2
GLOVE SURG LX STRL 7.5 STRW (GLOVE) ×1 IMPLANT
GLOVE SURG SYN 8.5  E (GLOVE) ×2
GLOVE SURG SYN 8.5 E (GLOVE) ×1 IMPLANT
GOWN STRL REUS W/ TWL LRG LVL3 (GOWN DISPOSABLE) ×2 IMPLANT
GOWN STRL REUS W/TWL LRG LVL3 (GOWN DISPOSABLE) ×4
LENS IOL TECNIS ITEC 24.0 (Intraocular Lens) ×3 IMPLANT
MARKER SKIN DUAL TIP RULER LAB (MISCELLANEOUS) ×3 IMPLANT
PACK DR. KING ARMS (PACKS) ×3 IMPLANT
PACK EYE AFTER SURG (MISCELLANEOUS) ×3 IMPLANT
PACK OPTHALMIC (MISCELLANEOUS) ×3 IMPLANT
SYR 3ML LL SCALE MARK (SYRINGE) ×3 IMPLANT
SYR TB 1ML LUER SLIP (SYRINGE) ×3 IMPLANT
WATER STERILE IRR 250ML POUR (IV SOLUTION) ×3 IMPLANT
WIPE NON LINTING 3.25X3.25 (MISCELLANEOUS) ×3 IMPLANT

## 2018-10-10 NOTE — H&P (Signed)

## 2018-10-10 NOTE — Op Note (Signed)
OPERATIVE NOTE  Christina Jordan [id number] 10/10/2018   PREOPERATIVE DIAGNOSIS:  Nuclear sclerotic cataract right eye.  H25.11   POSTOPERATIVE DIAGNOSIS:    Nuclear sclerotic cataract right eye.     PROCEDURE:  Phacoemusification with posterior chamber intraocular lens placement of the right eye   LENS:   Implant Name Type Inv. Item Serial No. Manufacturer Lot No. LRB No. Used Action  LENS IOL DIOP 24.0 - [id number] Intraocular Lens LENS IOL DIOP 24.0 [id number] AMO  Right 1 Implanted       PCB00 +24.0   ULTRASOUND TIME: 0 minutes 37 seconds.  CDE 3.95   SURGEON:  Benay Pillow, MD, MPH  ANESTHESIOLOGIST: Anesthesiologist: Darrin Nipper, MD CRNA: Cameron Ali, CRNA   ANESTHESIA:  Topical with tetracaine drops augmented with 1% preservative-free intracameral lidocaine.  ESTIMATED BLOOD LOSS: less than 1 mL.   COMPLICATIONS:  None.   DESCRIPTION OF PROCEDURE:  The patient was identified in the holding room and transported to the operating room and placed in the supine position under the operating microscope.  The right eye was identified as the operative eye and it was prepped and draped in the usual sterile ophthalmic fashion.   A 1.0 millimeter clear-corneal paracentesis was made at the 10:30 position. 0.5 ml of preservative-free 1% lidocaine with epinephrine was injected into the anterior chamber.  The anterior chamber was filled with Healon 5 viscoelastic.  A 2.4 millimeter keratome was used to make a near-clear corneal incision at the 8:00 position.  A curvilinear capsulorrhexis was made with a cystotome and capsulorrhexis forceps.  Balanced salt solution was used to hydrodissect and hydrodelineate the nucleus.   Phacoemulsification was then used in stop and chop fashion to remove the lens nucleus and epinucleus.  The remaining cortex was then removed using the irrigation and aspiration handpiece. Healon was then placed into the capsular bag to distend it for lens placement.  A  lens was then injected into the capsular bag.  The remaining viscoelastic was aspirated.   Wounds were hydrated with balanced salt solution.  The anterior chamber was inflated to a physiologic pressure with balanced salt solution.   Intracameral vigamox 0.1 mL undiluted was injected into the eye and a drop placed onto the ocular surface.  No wound leaks were noted.  The patient was taken to the recovery room in stable condition without complications of anesthesia or surgery  Benay Pillow 10/10/2018, 2:39 PM

## 2018-10-10 NOTE — Anesthesia Postprocedure Evaluation (Signed)
Anesthesia Post Note  Patient: Christina Jordan  Procedure(s) Performed: CATARACT EXTRACTION PHACO AND INTRAOCULAR LENS PLACEMENT (IOC)  RIGHT (Right Eye)  Patient location during evaluation: PACU Anesthesia Type: MAC Level of consciousness: awake and alert, oriented and patient cooperative Pain management: pain level controlled Vital Signs Assessment: post-procedure vital signs reviewed and stable Respiratory status: spontaneous breathing, nonlabored ventilation and respiratory function stable Cardiovascular status: blood pressure returned to baseline and stable Postop Assessment: adequate PO intake Anesthetic complications: no    Darrin Nipper

## 2018-10-10 NOTE — Anesthesia Procedure Notes (Signed)
Procedure Name: MAC Date/Time: 10/10/2018 2:19 PM Performed by: Cameron Ali, CRNA Pre-anesthesia Checklist: Patient identified, Emergency Drugs available, Suction available, Timeout performed and Patient being monitored Patient Re-evaluated:Patient Re-evaluated prior to induction Oxygen Delivery Method: Nasal cannula Placement Confirmation: positive ETCO2

## 2018-10-10 NOTE — Transfer of Care (Signed)
Immediate Anesthesia Transfer of Care Note  Patient: Christina Jordan  Procedure(s) Performed: CATARACT EXTRACTION PHACO AND INTRAOCULAR LENS PLACEMENT (IOC)  RIGHT (Right Eye)  Patient Location: PACU  Anesthesia Type: MAC  Level of Consciousness: awake, alert  and patient cooperative  Airway and Oxygen Therapy: Patient Spontanous Breathing and Patient connected to supplemental oxygen  Post-op Assessment: Post-op Vital signs reviewed, Patient's Cardiovascular Status Stable, Respiratory Function Stable, Patent Airway and No signs of Nausea or vomiting  Post-op Vital Signs: Reviewed and stable  Complications: No apparent anesthesia complications

## 2018-10-13 ENCOUNTER — Encounter: Payer: Self-pay | Admitting: Ophthalmology

## 2018-10-30 NOTE — Discharge Instructions (Signed)

## 2018-11-04 ENCOUNTER — Other Ambulatory Visit
Admission: RE | Admit: 2018-11-04 | Discharge: 2018-11-04 | Disposition: A | Discharge status: Home / Self Care | Payer: Medicare Other | Source: Ambulatory Visit | Attending: Ophthalmology | Admitting: Ophthalmology

## 2018-11-04 ENCOUNTER — Other Ambulatory Visit: Payer: Self-pay

## 2018-11-04 DIAGNOSIS — Z1159 Encounter for screening for other viral diseases: Secondary | ICD-10-CM | POA: Diagnosis present

## 2018-11-04 LAB — SARS CORONAVIRUS 2 (TAT 6-24 HRS): SARS Coronavirus 2: NEGATIVE

## 2018-11-07 ENCOUNTER — Ambulatory Visit: Payer: Medicare Other | Admitting: Anesthesiology

## 2018-11-07 ENCOUNTER — Encounter
Admission: RE | Disposition: A | Discharge status: Home / Self Care | Payer: Self-pay | Source: Home / Self Care | Attending: Ophthalmology

## 2018-11-07 ENCOUNTER — Ambulatory Visit
Admission: RE | Admit: 2018-11-07 | Discharge: 2018-11-07 | Disposition: A | Discharge status: Home / Self Care | Payer: Medicare Other | Attending: Ophthalmology | Admitting: Ophthalmology

## 2018-11-07 DIAGNOSIS — Z87891 Personal history of nicotine dependence: Secondary | ICD-10-CM | POA: Diagnosis not present

## 2018-11-07 DIAGNOSIS — I10 Essential (primary) hypertension: Secondary | ICD-10-CM | POA: Insufficient documentation

## 2018-11-07 DIAGNOSIS — H2512 Age-related nuclear cataract, left eye: Secondary | ICD-10-CM | POA: Insufficient documentation

## 2018-11-07 DIAGNOSIS — E7800 Pure hypercholesterolemia, unspecified: Secondary | ICD-10-CM | POA: Diagnosis not present

## 2018-11-07 HISTORY — PX: CATARACT EXTRACTION W/PHACO: SHX586

## 2018-11-07 SURGERY — PHACOEMULSIFICATION, CATARACT, WITH IOL INSERTION
Anesthesia: Monitor Anesthesia Care | Site: Eye | Laterality: Left

## 2018-11-07 MED ORDER — LIDOCAINE HCL (PF) 2 % IJ SOLN
INTRAOCULAR | Status: DC | PRN
  Administered 2018-11-07: 1 mL via INTRAOCULAR

## 2018-11-07 MED ORDER — MIDAZOLAM HCL 2 MG/2ML IJ SOLN
INTRAMUSCULAR | Status: DC | PRN
  Administered 2018-11-07: 2 mg via INTRAVENOUS

## 2018-11-07 MED ORDER — SODIUM HYALURONATE 10 MG/ML IO SOLN
INTRAOCULAR | Status: DC | PRN
  Administered 2018-11-07: 0.55 mL via INTRAOCULAR

## 2018-11-07 MED ORDER — FENTANYL CITRATE (PF) 100 MCG/2ML IJ SOLN
INTRAMUSCULAR | Status: DC | PRN
  Administered 2018-11-07: 100 ug via INTRAVENOUS

## 2018-11-07 MED ORDER — TETRACAINE HCL 0.5 % OP SOLN
1.0000 [drp] | OPHTHALMIC | Status: DC | PRN
  Administered 2018-11-07 (×3): 1 [drp] via OPHTHALMIC

## 2018-11-07 MED ORDER — SODIUM HYALURONATE 23 MG/ML IO SOLN
INTRAOCULAR | Status: DC | PRN
  Administered 2018-11-07: 0.6 mL via INTRAOCULAR

## 2018-11-07 MED ORDER — MOXIFLOXACIN HCL 0.5 % OP SOLN
OPHTHALMIC | Status: DC | PRN
  Administered 2018-11-07: 0.2 mL via OPHTHALMIC

## 2018-11-07 MED ORDER — ARMC OPHTHALMIC DILATING DROPS
1.0000 "application " | OPHTHALMIC | Status: DC | PRN
  Administered 2018-11-07 (×3): 1 via OPHTHALMIC

## 2018-11-07 MED ORDER — EPINEPHRINE PF 1 MG/ML IJ SOLN
INTRAOCULAR | Status: DC | PRN
  Administered 2018-11-07: 11:00:00 85 mL via OPHTHALMIC

## 2018-11-07 SURGICAL SUPPLY — 19 items
CANNULA ANT/CHMB 27G (MISCELLANEOUS) ×2 IMPLANT
CANNULA ANT/CHMB 27GA (MISCELLANEOUS) ×6 IMPLANT
DISSECTOR HYDRO NUCLEUS 50X22 (MISCELLANEOUS) ×3 IMPLANT
GLOVE SURG LX 7.5 STRW (GLOVE) ×2
GLOVE SURG LX STRL 7.5 STRW (GLOVE) ×1 IMPLANT
GLOVE SURG SYN 8.5  E (GLOVE) ×2
GLOVE SURG SYN 8.5 E (GLOVE) ×1 IMPLANT
GLOVE SURG SYN 8.5 PF PI (GLOVE) ×1 IMPLANT
GOWN STRL REUS W/ TWL LRG LVL3 (GOWN DISPOSABLE) ×2 IMPLANT
GOWN STRL REUS W/TWL LRG LVL3 (GOWN DISPOSABLE) ×4
LENS IOL TECNIS ITEC 24.0 (Intraocular Lens) ×2 IMPLANT
MARKER SKIN DUAL TIP RULER LAB (MISCELLANEOUS) ×3 IMPLANT
PACK DR. KING ARMS (PACKS) ×3 IMPLANT
PACK EYE AFTER SURG (MISCELLANEOUS) ×3 IMPLANT
PACK OPTHALMIC (MISCELLANEOUS) ×3 IMPLANT
SYR 3ML LL SCALE MARK (SYRINGE) ×3 IMPLANT
SYR TB 1ML LUER SLIP (SYRINGE) ×3 IMPLANT
WATER STERILE IRR 250ML POUR (IV SOLUTION) ×3 IMPLANT
WIPE NON LINTING 3.25X3.25 (MISCELLANEOUS) ×3 IMPLANT

## 2018-11-07 NOTE — Anesthesia Procedure Notes (Signed)
Procedure Name: MAC Performed by: Tressa Maldonado, CRNA Pre-anesthesia Checklist: Patient identified, Emergency Drugs available, Suction available, Timeout performed and Patient being monitored Patient Re-evaluated:Patient Re-evaluated prior to induction Oxygen Delivery Method: Nasal cannula Placement Confirmation: positive ETCO2       

## 2018-11-07 NOTE — H&P (Signed)

## 2018-11-07 NOTE — Transfer of Care (Signed)
Immediate Anesthesia Transfer of Care Note  Patient: Christina Jordan  Procedure(s) Performed: CATARACT EXTRACTION PHACO AND INTRAOCULAR LENS PLACEMENT (IOC)  LEFT (Left Eye)  Patient Location: PACU  Anesthesia Type: MAC  Level of Consciousness: awake, alert  and patient cooperative  Airway and Oxygen Therapy: Patient Spontanous Breathing and Patient connected to supplemental oxygen  Post-op Assessment: Post-op Vital signs reviewed, Patient's Cardiovascular Status Stable, Respiratory Function Stable, Patent Airway and No signs of Nausea or vomiting  Post-op Vital Signs: Reviewed and stable  Complications: No apparent anesthesia complications

## 2018-11-07 NOTE — Op Note (Signed)
OPERATIVE NOTE  Christina Jordan [id number] 11/07/2018   PREOPERATIVE DIAGNOSIS:  Nuclear sclerotic cataract left eye.  H25.12   POSTOPERATIVE DIAGNOSIS:    Nuclear sclerotic cataract left eye.     PROCEDURE:  Phacoemusification with posterior chamber intraocular lens placement of the left eye   LENS:   Implant Name Type Inv. Item Serial No. Manufacturer Lot No. LRB No. Used Action  LENS IOL DIOP 24.0 - [id number] Intraocular Lens LENS IOL DIOP 24.0 [id number] AMO  Left 1 Implanted       PCB00 +24.0   ULTRASOUND TIME: 0 minutes 44 seconds.  CDE 5.16   SURGEON:  Benay Pillow, MD, MPH   ANESTHESIA:  Topical with tetracaine drops augmented with 1% preservative-free intracameral lidocaine.  ESTIMATED BLOOD LOSS: <1 mL   COMPLICATIONS:  None.   DESCRIPTION OF PROCEDURE:  The patient was identified in the holding room and transported to the operating room and placed in the supine position under the operating microscope.  The left eye was identified as the operative eye and it was prepped and draped in the usual sterile ophthalmic fashion.   A 1.0 millimeter clear-corneal paracentesis was made at the 5:00 position. 0.5 ml of preservative-free 1% lidocaine with epinephrine was injected into the anterior chamber.  The anterior chamber was filled with Healon 5 viscoelastic.  A 2.4 millimeter keratome was used to make a near-clear corneal incision at the 2:00 position.  A curvilinear capsulorrhexis was made with a cystotome and capsulorrhexis forceps.  Balanced salt solution was used to hydrodissect and hydrodelineate the nucleus.   Phacoemulsification was then used in stop and chop fashion to remove the lens nucleus and epinucleus.  The remaining cortex was then removed using the irrigation and aspiration handpiece. Healon was then placed into the capsular bag to distend it for lens placement.  A lens was then injected into the capsular bag.  The remaining viscoelastic was aspirated.   Wounds  were hydrated with balanced salt solution.  The anterior chamber was inflated to a physiologic pressure with balanced salt solution.  Intracameral vigamox 0.1 mL undiltued was injected into the eye and a drop placed onto the ocular surface.  No wound leaks were noted.  The patient was taken to the recovery room in stable condition without complications of anesthesia or surgery  Benay Pillow 11/07/2018, 11:29 AM

## 2018-11-07 NOTE — Anesthesia Postprocedure Evaluation (Signed)
Anesthesia Post Note  Patient: Christina Jordan  Procedure(s) Performed: CATARACT EXTRACTION PHACO AND INTRAOCULAR LENS PLACEMENT (IOC)  LEFT (Left Eye)  Patient location during evaluation: PACU Anesthesia Type: MAC Level of consciousness: awake and alert Pain management: pain level controlled Vital Signs Assessment: post-procedure vital signs reviewed and stable Respiratory status: spontaneous breathing, nonlabored ventilation, respiratory function stable and patient connected to nasal cannula oxygen Cardiovascular status: stable and blood pressure returned to baseline Postop Assessment: no apparent nausea or vomiting Anesthetic complications: no    Trecia Rogers

## 2018-11-07 NOTE — Anesthesia Preprocedure Evaluation (Signed)
Anesthesia Evaluation  Patient identified by MRN, date of birth, ID band Patient awake    Reviewed: Allergy & Precautions, H&P , NPO status , Patient's Chart, lab work & pertinent test results, reviewed documented beta blocker date and time   Airway Mallampati: II  TM Distance: >3 FB Neck ROM: full    Dental no notable dental hx.    Pulmonary former smoker,    Pulmonary exam normal breath sounds clear to auscultation       Cardiovascular Exercise Tolerance: Good hypertension,  Rhythm:regular Rate:Normal     Neuro/Psych negative neurological ROS  negative psych ROS   GI/Hepatic Neg liver ROS, GERD  ,  Endo/Other  negative endocrine ROS  Renal/GU negative Renal ROS  negative genitourinary   Musculoskeletal   Abdominal   Peds  Hematology negative hematology ROS (+)   Anesthesia Other Findings   Reproductive/Obstetrics negative OB ROS                             Anesthesia Physical Anesthesia Plan  ASA: II  Anesthesia Plan: MAC   Post-op Pain Management:    Induction:   PONV Risk Score and Plan:   Airway Management Planned:   Additional Equipment:   Intra-op Plan:   Post-operative Plan:   Informed Consent: I have reviewed the patients History and Physical, chart, labs and discussed the procedure including the risks, benefits and alternatives for the proposed anesthesia with the patient or authorized representative who has indicated his/her understanding and acceptance.     Dental Advisory Given  Plan Discussed with: CRNA  Anesthesia Plan Comments:         Anesthesia Quick Evaluation

## 2019-06-18 ENCOUNTER — Other Ambulatory Visit: Payer: Self-pay | Admitting: Family Medicine

## 2019-06-18 DIAGNOSIS — Z1231 Encounter for screening mammogram for malignant neoplasm of breast: Secondary | ICD-10-CM

## 2019-07-09 ENCOUNTER — Ambulatory Visit: Payer: Medicare Other

## 2019-09-07 ENCOUNTER — Other Ambulatory Visit: Payer: Self-pay

## 2019-09-07 ENCOUNTER — Ambulatory Visit
Admission: RE | Admit: 2019-09-07 | Discharge: 2019-09-07 | Disposition: A | Discharge status: Home / Self Care | Payer: Medicare PPO | Source: Ambulatory Visit | Attending: Family Medicine | Admitting: Family Medicine

## 2019-09-07 DIAGNOSIS — Z1231 Encounter for screening mammogram for malignant neoplasm of breast: Secondary | ICD-10-CM | POA: Insufficient documentation

## 2020-07-25 ENCOUNTER — Other Ambulatory Visit: Payer: Self-pay | Admitting: Family Medicine

## 2020-07-25 DIAGNOSIS — Z1231 Encounter for screening mammogram for malignant neoplasm of breast: Secondary | ICD-10-CM

## 2020-09-07 ENCOUNTER — Ambulatory Visit
Admission: RE | Admit: 2020-09-07 | Discharge: 2020-09-07 | Disposition: A | Discharge status: Home / Self Care | Payer: Medicare PPO | Source: Ambulatory Visit | Attending: Family Medicine | Admitting: Family Medicine

## 2020-09-07 ENCOUNTER — Other Ambulatory Visit: Payer: Self-pay

## 2020-09-07 DIAGNOSIS — Z1231 Encounter for screening mammogram for malignant neoplasm of breast: Secondary | ICD-10-CM | POA: Diagnosis not present

## 2021-08-14 ENCOUNTER — Other Ambulatory Visit: Payer: Self-pay | Admitting: Family Medicine

## 2021-08-14 DIAGNOSIS — Z1231 Encounter for screening mammogram for malignant neoplasm of breast: Secondary | ICD-10-CM

## 2021-09-12 ENCOUNTER — Other Ambulatory Visit (INDEPENDENT_AMBULATORY_CARE_PROVIDER_SITE_OTHER): Payer: Self-pay | Admitting: Nurse Practitioner

## 2021-09-12 DIAGNOSIS — M79672 Pain in left foot: Secondary | ICD-10-CM

## 2021-09-13 ENCOUNTER — Encounter (INDEPENDENT_AMBULATORY_CARE_PROVIDER_SITE_OTHER): Payer: Self-pay | Admitting: Nurse Practitioner

## 2021-09-13 ENCOUNTER — Ambulatory Visit (INDEPENDENT_AMBULATORY_CARE_PROVIDER_SITE_OTHER): Payer: Medicare PPO

## 2021-09-13 ENCOUNTER — Ambulatory Visit (INDEPENDENT_AMBULATORY_CARE_PROVIDER_SITE_OTHER): Payer: Medicare PPO | Admitting: Nurse Practitioner

## 2021-09-13 VITALS — BP 122/76 | HR 84 | Resp 16 | Ht 67.0 in | Wt 214.0 lb

## 2021-09-13 DIAGNOSIS — M79672 Pain in left foot: Secondary | ICD-10-CM

## 2021-09-13 DIAGNOSIS — L819 Disorder of pigmentation, unspecified: Secondary | ICD-10-CM

## 2021-09-19 ENCOUNTER — Encounter (INDEPENDENT_AMBULATORY_CARE_PROVIDER_SITE_OTHER): Payer: Self-pay | Admitting: Nurse Practitioner

## 2021-09-19 NOTE — Progress Notes (Signed)
Subjective:    Patient ID: Christina Jordan, female    DOB: [birth year], 71 y.o.   MRN: [id number] Chief Complaint  Patient presents with   Establish Care    Referred by Dr Barbette Or Christina Jordan is a 71 year old female that presents today for evaluation of discoloration of her left lower extremity.  It was noted that her first digit became blue at the tip with any activity.  She notes that when this occurs it is painful.  She did note that she had a recent ingrown toenail and the pain went away with the relief of the ingrown toenail however the discoloration has not.  She denies any open wounds or ulcerations.  Today noninvasive studies show an ABI of 1.17 on the right and 1.21 on the left.  She has triphasic tibial artery waveforms bilaterally with normal toe waveforms bilaterally.   Review of Systems  Skin:  Positive for color change.  All other systems reviewed and are negative.     Objective:   Physical Exam Vitals reviewed.  HENT:     Head: Normocephalic.  Cardiovascular:     Rate and Rhythm: Normal rate.     Pulses:          Dorsalis pedis pulses are 1+ on the right side and 1+ on the left side.       Posterior tibial pulses are 1+ on the right side and 1+ on the left side.  Pulmonary:     Effort: Pulmonary effort is normal.  Skin:    General: Skin is warm and dry.  Neurological:     Mental Status: She is alert and oriented to person, place, and time.  Psychiatric:        Mood and Affect: Mood normal.        Behavior: Behavior normal.        Thought Content: Thought content normal.        Judgment: Judgment normal.    BP 122/76 (BP Location: Right Arm)   Pulse 84   Resp 16   Ht 5\' 7"  (1.702 m)   Wt 214 lb (97.1 kg)   BMI 33.52 kg/m   Past Medical History:  Diagnosis Date   Complication of anesthesia    hard to wake after hysterectomy   GERD (gastroesophageal reflux disease)    Headache    High cholesterol    Hypertension    Rosacea     Social  History   Socioeconomic History   Marital status: Married    Spouse name: Not on file   Number of children: Not on file   Years of education: Not on file   Highest education level: Not on file  Occupational History   Not on file  Tobacco Use   Smoking status: Former    Types: Cigarettes    Quit date: 05/02/1997    Years since quitting: 24.4   Smokeless tobacco: Never  Vaping Use   Vaping Use: Never used  Substance and Sexual Activity   Alcohol use: No    Comment: may have drink 2-3x/yr   Drug use: No   Sexual activity: Not on file  Other Topics Concern   Not on file  Social History Narrative   Not on file   Social Determinants of Health   Financial Resource Strain: Not on file  Food Insecurity: Not on file  Transportation Needs: Not on file  Physical Activity: Not on file  Stress: Not on file  Social Connections: Not on file  Intimate Partner Violence: Not on file    Past Surgical History:  Procedure Laterality Date   ABDOMINAL HYSTERECTOMY     CATARACT EXTRACTION W/PHACO Right 10/10/2018   Procedure: CATARACT EXTRACTION PHACO AND INTRAOCULAR LENS PLACEMENT (IOC)  RIGHT;  Surgeon: Nevada Crane, MD;  Location: [facility];  Service: Ophthalmology;  Laterality: Right;   CATARACT EXTRACTION W/PHACO Left 11/07/2018   Procedure: CATARACT EXTRACTION PHACO AND INTRAOCULAR LENS PLACEMENT (IOC)  LEFT;  Surgeon: Nevada Crane, MD;  Location: [facility];  Service: Ophthalmology;  Laterality: Left;   COLONOSCOPY     COLONOSCOPY WITH PROPOFOL N/A 09/20/2017   Procedure: COLONOSCOPY WITH PROPOFOL;  Surgeon: Scot Jun, MD;  Location: [facility];  Service: Endoscopy;  Laterality: N/A;   WISDOM TOOTH EXTRACTION      Family History  Problem Relation Age of Onset   Breast cancer Neg Hx     Allergies  Allergen Reactions   Codeine Itching       Latest Ref Rng & Units 08/07/2015   12:57 AM  CBC  WBC 3.6 - 11.0 K/uL 3.3    Hemoglobin 12.0  - 16.0 g/dL 43.1    Hematocrit 54.0 - 47.0 % 42.8    Platelets 150 - 440 K/uL 230        CMP     Component Value Date/Time   NA 138 08/07/2015 0057   K 4.2 08/07/2015 0057   CL 102 08/07/2015 0057   CO2 30 08/07/2015 0057   GLUCOSE 131 (H) 08/07/2015 0057   BUN 12 08/07/2015 0057   CREATININE 0.67 08/07/2015 0057   CALCIUM 9.3 08/07/2015 0057   PROT 7.7 08/07/2015 0057   ALBUMIN 4.1 08/07/2015 0057   AST 27 08/07/2015 0057   ALT 23 08/07/2015 0057   ALKPHOS 95 08/07/2015 0057   BILITOT 0.5 08/07/2015 0057   GFRNONAA >60 08/07/2015 0057   GFRAA >60 08/07/2015 0057     VAS [place] ABI WITH/WO TBI  Result Date: 09/14/2021  LOWER EXTREMITY DOPPLER STUDY Patient Name:  Christina Jordan  Date of Exam:   09/13/2021 Medical Rec #: [id number]      Accession #:    [id number] Date of Birth: 1950-06-25      Patient Gender: F Patient Age:   11 years Exam Location:  [facility] Procedure:      VAS [place] ABI WITH/WO TBI Referring Phys: Levora Dredge --------------------------------------------------------------------------------  Indications: Rest pain, and Lt Great Toe discoloration.  Performing Technologist: Debbe Bales RVS  Examination Guidelines: A complete evaluation includes at minimum, Doppler waveform signals and systolic blood pressure reading at the level of bilateral brachial, anterior tibial, and posterior tibial arteries, when vessel segments are accessible. Bilateral testing is considered an integral part of a complete examination. Photoelectric Plethysmograph (PPG) waveforms and toe systolic pressure readings are included as required and additional duplex testing as needed. Limited examinations for reoccurring indications may be performed as noted.  ABI Findings: +---------+------------------+-----+---------+--------+ Right    Rt Pressure (mmHg)IndexWaveform Comment  +---------+------------------+-----+---------+--------+ Brachial 140                                       +---------+------------------+-----+---------+--------+ ATA      137               0.98 triphasic         +---------+------------------+-----+---------+--------+  PTA      164               1.17 triphasic         +---------+------------------+-----+---------+--------+ Great Toe156               1.11 Normal            +---------+------------------+-----+---------+--------+ +---------+------------------+-----+---------+-------+ Left     Lt Pressure (mmHg)IndexWaveform Comment +---------+------------------+-----+---------+-------+ Brachial 133                                     +---------+------------------+-----+---------+-------+ ATA      154               1.10 triphasic        +---------+------------------+-----+---------+-------+ PTA      170               1.21 triphasic        +---------+------------------+-----+---------+-------+ Great Toe146               1.04 Normal           +---------+------------------+-----+---------+-------+ +-------+-----------+-----------+------------+------------+ ABI/TBIToday's ABIToday's TBIPrevious ABIPrevious TBI +-------+-----------+-----------+------------+------------+ Right  1.17       1.11                                +-------+-----------+-----------+------------+------------+ Left   1.21       1.04                                +-------+-----------+-----------+------------+------------+  Summary: Right: Resting right ankle-brachial index is within normal range. No evidence of significant right lower extremity arterial disease. The right toe-brachial index is normal. Left: Resting left ankle-brachial index is within normal range. No evidence of significant left lower extremity arterial disease. The left toe-brachial index is normal.  *See table(s) above for measurements and observations.  Electronically signed by Levora DredgeGregory Schnier MD on 09/14/2021 at 7:12:09 PM.    Final        Assessment & Plan:   1.  Left foot pain The patient was likely attributed to the ingrown toenail however that does not rule out reason for discoloration.  However studies today show that there are no acute arterial abnormalities. - VAS [place] ABI WITH/WO TBI  2. Discoloration of skin of foot Based on some of the patient's description of symptoms it may be related to venous insufficiency.  We will have the patient return at her convenience for reflux studies to determine if there is a venous insufficiency component possibly causing discoloration.   Current Outpatient Medications on File Prior to Visit  Medication Sig Dispense Refill   atorvastatin (LIPITOR) 10 MG tablet Take 10 mg by mouth daily.     calcium-vitamin D (OSCAL WITH D) 500-200 MG-UNIT tablet Take 1 tablet by mouth daily.     hydrochlorothiazide (HYDRODIURIL) 25 MG tablet Take 25 mg by mouth daily.     lisinopril (PRINIVIL,ZESTRIL) 10 MG tablet Take 10 mg by mouth daily.     No current facility-administered medications on file prior to visit.    There are no Patient Instructions on file for this visit. No follow-ups on file.   Georgiana SpinnerFallon E Michiko Lineman, NP

## 2021-09-21 ENCOUNTER — Ambulatory Visit
Admission: RE | Admit: 2021-09-21 | Discharge: 2021-09-21 | Disposition: A | Discharge status: Home / Self Care | Payer: Medicare PPO | Source: Ambulatory Visit | Attending: Family Medicine | Admitting: Family Medicine

## 2021-09-21 DIAGNOSIS — Z1231 Encounter for screening mammogram for malignant neoplasm of breast: Secondary | ICD-10-CM | POA: Insufficient documentation

## 2021-09-27 ENCOUNTER — Ambulatory Visit
Admission: RE | Admit: 2021-09-27 | Discharge: 2021-09-27 | Disposition: A | Discharge status: Home / Self Care | Payer: Medicare PPO | Source: Ambulatory Visit | Attending: Family Medicine | Admitting: Family Medicine

## 2021-09-27 ENCOUNTER — Other Ambulatory Visit: Payer: Self-pay | Admitting: Family Medicine

## 2021-09-27 DIAGNOSIS — R928 Other abnormal and inconclusive findings on diagnostic imaging of breast: Secondary | ICD-10-CM | POA: Diagnosis present

## 2021-09-27 DIAGNOSIS — N6489 Other specified disorders of breast: Secondary | ICD-10-CM | POA: Diagnosis present

## 2021-09-27 DIAGNOSIS — R921 Mammographic calcification found on diagnostic imaging of breast: Secondary | ICD-10-CM

## 2021-10-27 ENCOUNTER — Other Ambulatory Visit (INDEPENDENT_AMBULATORY_CARE_PROVIDER_SITE_OTHER): Payer: Self-pay | Admitting: Nurse Practitioner

## 2021-10-27 DIAGNOSIS — L819 Disorder of pigmentation, unspecified: Secondary | ICD-10-CM

## 2021-10-27 DIAGNOSIS — M79672 Pain in left foot: Secondary | ICD-10-CM

## 2021-10-30 ENCOUNTER — Ambulatory Visit (INDEPENDENT_AMBULATORY_CARE_PROVIDER_SITE_OTHER): Payer: Medicare PPO

## 2021-10-30 ENCOUNTER — Encounter (INDEPENDENT_AMBULATORY_CARE_PROVIDER_SITE_OTHER): Payer: Self-pay | Admitting: Vascular Surgery

## 2021-10-30 ENCOUNTER — Ambulatory Visit (INDEPENDENT_AMBULATORY_CARE_PROVIDER_SITE_OTHER): Payer: Medicare PPO | Admitting: Nurse Practitioner

## 2021-10-30 DIAGNOSIS — M79672 Pain in left foot: Secondary | ICD-10-CM | POA: Diagnosis not present

## 2021-10-30 DIAGNOSIS — I872 Venous insufficiency (chronic) (peripheral): Secondary | ICD-10-CM | POA: Diagnosis not present

## 2021-10-30 DIAGNOSIS — L819 Disorder of pigmentation, unspecified: Secondary | ICD-10-CM

## 2021-10-30 DIAGNOSIS — I10 Essential (primary) hypertension: Secondary | ICD-10-CM | POA: Diagnosis not present

## 2021-10-30 DIAGNOSIS — E782 Mixed hyperlipidemia: Secondary | ICD-10-CM

## 2021-10-30 NOTE — Progress Notes (Signed)
MRN : [id number]  Christina Jordan is a 71 y.o. ([birth year]) female who presents with chief complaint of legs hurt and swell.  History of Present Illness: Christina Jordan is a 71 year old female that presents today for evaluation of discoloration of her left lower extremity.  It was noted that her first digit became blue at the tip with any activity.  She notes that when this occurs it is painful.  She did note that she had a recent ingrown toenail and the pain went away with the relief of the ingrown toenail however the discoloration has not.  She denies any open wounds or ulcerations.   Previous noninvasive studies show an ABI of 1.17 on the right and 1.21 on the left.  She has triphasic tibial artery waveforms bilaterally with normal toe waveforms bilaterally.  Today, it is noted that the patient does not have any DVT or superficial thrombophlebitis in the left lower extremity.  She does have evidence of superficial venous reflux extending from the saphenofemoral junction to the proximal calf.  No evidence of deep venous insufficiency. Current Meds  Medication Sig   atorvastatin (LIPITOR) 10 MG tablet Take 10 mg by mouth daily.   calcium-vitamin D (OSCAL WITH D) 500-200 MG-UNIT tablet Take 1 tablet by mouth daily.   hydrochlorothiazide (HYDRODIURIL) 25 MG tablet Take 25 mg by mouth daily.   lisinopril (PRINIVIL,ZESTRIL) 10 MG tablet Take 10 mg by mouth daily.    Past Medical History:  Diagnosis Date   Complication of anesthesia    hard to wake after hysterectomy   GERD (gastroesophageal reflux disease)    Headache    High cholesterol    Hypertension    Rosacea     Past Surgical History:  Procedure Laterality Date   ABDOMINAL HYSTERECTOMY     CATARACT EXTRACTION W/PHACO Right 10/10/2018   Procedure: CATARACT EXTRACTION PHACO AND INTRAOCULAR LENS PLACEMENT (IOC)  RIGHT;  Surgeon: Nevada Crane, MD;  Location: [facility];  Service: Ophthalmology;  Laterality:  Right;   CATARACT EXTRACTION W/PHACO Left 11/07/2018   Procedure: CATARACT EXTRACTION PHACO AND INTRAOCULAR LENS PLACEMENT (IOC)  LEFT;  Surgeon: Nevada Crane, MD;  Location[facility];  Service: Ophthalmology;  Laterality: Left;   COLONOSCOPY     COLONOSCOPY WITH PROPOFOL N/A 09/20/2017   Procedure: COLONOSCOPY WITH PROPOFOL;  Surgeon: Scot Jun, MD;  Location: [facility];  Service: Endoscopy;  Laterality: N/A;   WISDOM TOOTH EXTRACTION      Social History Social History   Tobacco Use   Smoking status: Former    Types: Cigarettes    Quit date: 05/02/1997    Years since quitting: 24.5   Smokeless tobacco: Never  Vaping Use   Vaping Use: Never used  Substance Use Topics   Alcohol use: No    Comment: may have drink 2-3x/yr   Drug use: No    Family History Family History  Problem Relation Age of Onset   Breast cancer Neg Hx     Allergies  Allergen Reactions   Codeine Itching     REVIEW OF SYSTEMS (Negative unless checked)  Constitutional: [] Weight loss  [] Fever  [] Chills Cardiac: [] Chest pain   [] Chest pressure   [] Palpitations   [] Shortness of breath when laying flat   [] Shortness of breath with exertion. Vascular:  [] Pain in legs with walking   [] Pain in legs at rest  [] History of DVT   []   Phlebitis   [x] Swelling in legs   [x] Varicose veins   [] Non-healing ulcers Pulmonary:   [] Uses home oxygen   [] Productive cough   [] Hemoptysis   [] Wheeze  [] COPD   [] Asthma Neurologic:  [] Dizziness   [] Seizures   [] History of stroke   [] History of TIA  [] Aphasia   [] Vissual changes   [] Weakness or numbness in arm   [] Weakness or numbness in leg Musculoskeletal:   [] Joint swelling   [] Joint pain   [] Low back pain Hematologic:  [] Easy bruising  [] Easy bleeding   [] Hypercoagulable state   [] Anemic Gastrointestinal:  [] Diarrhea   [] Vomiting  [] Gastroesophageal reflux/heartburn   [] Difficulty swallowing. Genitourinary:  [] Chronic kidney disease   [] Difficult  urination  [] Frequent urination   [] Blood in urine Skin:  [] Rashes   [] Ulcers  Psychological:  [] History of anxiety   []  History of major depression.  Physical Examination  Vitals:   10/30/21 0839  BP: 135/89  Pulse: 76  Resp: 16  Weight: 214 lb 9.6 oz (97.3 kg)   Body mass index is 33.61 kg/m. Gen: WD/WN, NAD Head: [place]/AT, No temporalis wasting.  Ear/Nose/Throat: Hearing grossly intact, nares w/o erythema or drainage, pinna without lesions Eyes: PER, EOMI, sclera nonicteric.  Neck: Supple, no gross masses.  No JVD.  Pulmonary:  Good air movement, no audible wheezing, no use of accessory muscles.  Cardiac: RRR, precordium not hyperdynamic. Vascular:  scattered varicosities present bilaterally.  1+ edema left Vessel Right Left  Radial Palpable Palpable  Gastrointestinal: soft, non-distended. No guarding/no peritoneal signs.  Musculoskeletal: M/S 5/5 throughout.  No deformity.  Neurologic: CN 2-12 intact. Pain and light touch intact in extremities.  Symmetrical.  Speech is fluent. Motor exam as listed above. Psychiatric: Judgment intact, Mood & affect appropriate for pt's clinical situation. Dermatologic: no ulcers noted.  No changes consistent with cellulitis. Lymph : No lichenification or skin changes of chronic lymphedema.  CBC Lab Results  Component Value Date   WBC 3.3 (L) 08/07/2015   HGB 14.5 08/07/2015   HCT 42.8 08/07/2015   MCV 87.2 08/07/2015   PLT 230 08/07/2015    BMET    Component Value Date/Time   NA 138 08/07/2015 0057   K 4.2 08/07/2015 0057   CL 102 08/07/2015 0057   CO2 30 08/07/2015 0057   GLUCOSE 131 (H) 08/07/2015 0057   BUN 12 08/07/2015 0057   CREATININE 0.67 08/07/2015 0057   CALCIUM 9.3 08/07/2015 0057   GFRNONAA >60 08/07/2015 0057   GFRAA >60 08/07/2015 0057   CrCl cannot be calculated (Patient's most recent lab result is older than the maximum 21 days allowed.).  COAG No results found for: "INR", "PROTIME"  Radiology VAS [place] LOWER  EXTREMITY VENOUS REFLUX  Result Date: 10/30/2021  Lower Venous Reflux Study Patient Name:  Christina Jordan  Date of Exam:   10/30/2021 Medical Rec #: [id number]      Accession #:    [id number] Date of Birth: 08-04-1950      Patient Gender: F Patient Age:   78 years Exam Location:  [facility] Procedure:      VAS [place] LOWER EXTREMITY VENOUS REFLUX Referring Phys: Eulogio Ditch --------------------------------------------------------------------------------  Indications: Swelling.  Performing Technologist: Almira Coaster RVS  Examination Guidelines: A complete evaluation includes B-mode imaging, spectral Doppler, color Doppler, and power Doppler as needed of all accessible portions of each vessel. Bilateral testing is considered an integral part of a complete examination. Limited examinations for reoccurring indications may be performed as noted.  The reflux portion of the exam is performed with the patient in reverse Trendelenburg. Significant venous reflux is defined as >500 ms in the superficial venous system, and >1 second in the deep venous system.  +--------------+---------+------+-----------+------------+--------+ LEFT          Reflux NoRefluxReflux TimeDiameter cmsComments                         Yes                                  +--------------+---------+------+-----------+------------+--------+ CFV           no                                             +--------------+---------+------+-----------+------------+--------+ FV prox       no                                             +--------------+---------+------+-----------+------------+--------+ FV mid        no                                             +--------------+---------+------+-----------+------------+--------+ FV dist       no                                             +--------------+---------+------+-----------+------------+--------+ Popliteal     no                                              +--------------+---------+------+-----------+------------+--------+ GSV at SFJ              yes    1518 ms      .98              +--------------+---------+------+-----------+------------+--------+ GSV prox thigh          yes    1973 ms      1.24             +--------------+---------+------+-----------+------------+--------+ GSV mid thigh           yes    2516 ms      .76              +--------------+---------+------+-----------+------------+--------+ GSV dist thigh          yes    4489 ms      .80              +--------------+---------+------+-----------+------------+--------+ GSV at knee             yes    2806 ms      .69              +--------------+---------+------+-----------+------------+--------+ GSV prox calf           yes    3719 ms      .  76              +--------------+---------+------+-----------+------------+--------+ SSV Pop Fossa no                            .51              +--------------+---------+------+-----------+------------+--------+   Summary: Left: - No evidence of deep vein thrombosis seen in the left lower extremity, from the common femoral through the popliteal veins. - No evidence of superficial venous thrombosis in the left lower extremity. - There is no evidence of venous reflux seen in the left lower extremity. - Venous reflux is noted in the left sapheno-femoral junction. - Venous reflux is noted in the left greater saphenous vein in the thigh. - Venous reflux is noted in the left greater saphenous vein in the calf. - Reflux seen alson in the Left GSV Knee level.  *See table(s) above for measurements and observations. Electronically signed by Hortencia Pilar MD on 10/30/2021 at 5:15:04 PM.    Final      Assessment/Plan 1. Chronic venous insufficiency The patient does have evidence of significant venous reflux in the left lower extremity.  We have discussed options for treatment including conservative therapy which  includes use of medical grade compression, elevation and activity.  Have also discussed more invasive treatment such as endovenous laser ablation.  At this time the patient wishes to continue with conservative therapy.  We will have the patient follow-up with [place] on an as-needed basis if she notices that her symptoms worsen or she begins to have issues with things such as pain or worsening swelling.  2. Essential hypertension Continue antihypertensive medications as already ordered, these medications have been reviewed and there are no changes at this time.   3. Mixed hyperlipidemia Continue statin as ordered and reviewed, no changes at this time     Kris Hartmann, NP  11/12/2021 3:19 PM

## 2021-11-03 DIAGNOSIS — H903 Sensorineural hearing loss, bilateral: Secondary | ICD-10-CM | POA: Diagnosis not present

## 2021-11-03 DIAGNOSIS — H60331 Swimmer's ear, right ear: Secondary | ICD-10-CM | POA: Diagnosis not present

## 2021-11-03 DIAGNOSIS — H9313 Tinnitus, bilateral: Secondary | ICD-10-CM | POA: Diagnosis not present

## 2021-11-12 ENCOUNTER — Encounter (INDEPENDENT_AMBULATORY_CARE_PROVIDER_SITE_OTHER): Payer: Self-pay | Admitting: Nurse Practitioner

## 2022-02-08 DIAGNOSIS — Z1331 Encounter for screening for depression: Secondary | ICD-10-CM | POA: Diagnosis not present

## 2022-02-08 DIAGNOSIS — G43109 Migraine with aura, not intractable, without status migrainosus: Secondary | ICD-10-CM | POA: Diagnosis not present

## 2022-02-08 DIAGNOSIS — E7800 Pure hypercholesterolemia, unspecified: Secondary | ICD-10-CM | POA: Diagnosis not present

## 2022-02-08 DIAGNOSIS — I10 Essential (primary) hypertension: Secondary | ICD-10-CM | POA: Diagnosis not present

## 2022-02-08 DIAGNOSIS — Z0000 Encounter for general adult medical examination without abnormal findings: Secondary | ICD-10-CM | POA: Diagnosis not present

## 2022-02-08 DIAGNOSIS — K219 Gastro-esophageal reflux disease without esophagitis: Secondary | ICD-10-CM | POA: Diagnosis not present

## 2022-02-19 ENCOUNTER — Encounter (INDEPENDENT_AMBULATORY_CARE_PROVIDER_SITE_OTHER): Payer: Self-pay

## 2022-08-14 ENCOUNTER — Other Ambulatory Visit: Payer: Self-pay | Admitting: Family Medicine

## 2022-08-14 DIAGNOSIS — Z1231 Encounter for screening mammogram for malignant neoplasm of breast: Secondary | ICD-10-CM

## 2022-08-17 DIAGNOSIS — R400 Somnolence: Secondary | ICD-10-CM | POA: Diagnosis not present

## 2022-08-17 DIAGNOSIS — K219 Gastro-esophageal reflux disease without esophagitis: Secondary | ICD-10-CM | POA: Diagnosis not present

## 2022-08-17 DIAGNOSIS — I10 Essential (primary) hypertension: Secondary | ICD-10-CM | POA: Diagnosis not present

## 2022-08-17 DIAGNOSIS — G43109 Migraine with aura, not intractable, without status migrainosus: Secondary | ICD-10-CM | POA: Diagnosis not present

## 2022-08-17 DIAGNOSIS — E7800 Pure hypercholesterolemia, unspecified: Secondary | ICD-10-CM | POA: Diagnosis not present

## 2022-10-04 ENCOUNTER — Ambulatory Visit
Admission: RE | Admit: 2022-10-04 | Discharge: 2022-10-04 | Disposition: A | Discharge status: Home / Self Care | Payer: Medicare PPO | Source: Ambulatory Visit | Attending: Family Medicine | Admitting: Family Medicine

## 2022-10-04 DIAGNOSIS — Z1231 Encounter for screening mammogram for malignant neoplasm of breast: Secondary | ICD-10-CM | POA: Diagnosis not present

## 2022-11-19 DIAGNOSIS — M7989 Other specified soft tissue disorders: Secondary | ICD-10-CM | POA: Diagnosis not present

## 2022-11-25 DIAGNOSIS — M7989 Other specified soft tissue disorders: Secondary | ICD-10-CM | POA: Diagnosis not present

## 2022-11-28 DIAGNOSIS — M76822 Posterior tibial tendinitis, left leg: Secondary | ICD-10-CM | POA: Diagnosis not present

## 2022-11-28 DIAGNOSIS — R262 Difficulty in walking, not elsewhere classified: Secondary | ICD-10-CM | POA: Diagnosis not present

## 2022-11-28 DIAGNOSIS — M7989 Other specified soft tissue disorders: Secondary | ICD-10-CM | POA: Diagnosis not present

## 2022-11-28 DIAGNOSIS — M79672 Pain in left foot: Secondary | ICD-10-CM | POA: Diagnosis not present

## 2022-11-28 DIAGNOSIS — M7672 Peroneal tendinitis, left leg: Secondary | ICD-10-CM | POA: Diagnosis not present

## 2022-11-28 DIAGNOSIS — M722 Plantar fascial fibromatosis: Secondary | ICD-10-CM | POA: Diagnosis not present

## 2022-12-06 ENCOUNTER — Other Ambulatory Visit: Payer: Self-pay

## 2022-12-06 ENCOUNTER — Emergency Department
Admission: EM | Admit: 2022-12-06 | Discharge: 2022-12-06 | Disposition: A | Discharge status: Home / Self Care | Payer: Medicare PPO | Attending: Emergency Medicine | Admitting: Emergency Medicine

## 2022-12-06 ENCOUNTER — Encounter: Payer: Self-pay | Admitting: Family Medicine

## 2022-12-06 ENCOUNTER — Emergency Department: Payer: Medicare PPO

## 2022-12-06 ENCOUNTER — Ambulatory Visit
Admission: EM | Admit: 2022-12-06 | Discharge: 2022-12-06 | Payer: Medicare PPO | Attending: Family Medicine | Admitting: Family Medicine

## 2022-12-06 DIAGNOSIS — R1011 Right upper quadrant pain: Secondary | ICD-10-CM | POA: Insufficient documentation

## 2022-12-06 DIAGNOSIS — K824 Cholesterolosis of gallbladder: Secondary | ICD-10-CM | POA: Diagnosis not present

## 2022-12-06 DIAGNOSIS — I10 Essential (primary) hypertension: Secondary | ICD-10-CM | POA: Diagnosis not present

## 2022-12-06 DIAGNOSIS — K625 Hemorrhage of anus and rectum: Secondary | ICD-10-CM

## 2022-12-06 DIAGNOSIS — R109 Unspecified abdominal pain: Secondary | ICD-10-CM | POA: Diagnosis not present

## 2022-12-06 LAB — CBC
HCT: 42.2 % (ref 36.0–46.0)
Hemoglobin: 14 g/dL (ref 12.0–15.0)
MCH: 30.5 pg (ref 26.0–34.0)
MCHC: 33.2 g/dL (ref 30.0–36.0)
MCV: 91.9 fL (ref 80.0–100.0)
Platelets: 320 10*3/uL (ref 150–400)
RBC: 4.59 MIL/uL (ref 3.87–5.11)
RDW: 13.2 % (ref 11.5–15.5)
WBC: 6.5 10*3/uL (ref 4.0–10.5)
nRBC: 0 % (ref 0.0–0.2)

## 2022-12-06 LAB — COMPREHENSIVE METABOLIC PANEL
ALT: 22 U/L (ref 0–44)
AST: 21 U/L (ref 15–41)
Albumin: 4 g/dL (ref 3.5–5.0)
Alkaline Phosphatase: 93 U/L (ref 38–126)
Anion gap: 11 (ref 5–15)
BUN: 15 mg/dL (ref 8–23)
CO2: 25 mmol/L (ref 22–32)
Calcium: 9.2 mg/dL (ref 8.9–10.3)
Chloride: 102 mmol/L (ref 98–111)
Creatinine, Ser: 0.84 mg/dL (ref 0.44–1.00)
GFR, Estimated: >60 mL/min (ref >60)
Glucose, Bld: 98 mg/dL (ref 70–99)
Potassium: 3.5 mmol/L (ref 3.5–5.1)
Sodium: 138 mmol/L (ref 135–145)
Total Bilirubin: 0.8 mg/dL (ref 0.3–1.2)
Total Protein: 7.3 g/dL (ref 6.5–8.1)

## 2022-12-06 LAB — TYPE AND SCREEN
ABO/RH(D): O NEG
Antibody Screen: NEGATIVE

## 2022-12-06 LAB — LIPASE, BLOOD: Lipase: 28 U/L (ref 11–51)

## 2022-12-06 NOTE — ED Triage Notes (Signed)
Around 8/8 pt felt a sudden sharp R flank pain and then started experiencing rectal soreness between the 8th and 12th. On Sunday pt had  1st large bright red bloody stool and continue to have 1 large bloody stool on Monday and Tuesday. On Monday and Tuesday stools had streaks of blood with bloody tissues when wiping. Pt is c/o epigastric pain w/o nausea or vomiting

## 2022-12-06 NOTE — Discharge Instructions (Signed)

## 2022-12-06 NOTE — ED Provider Notes (Signed)
[facility] Provider Note    Event Date/Time   First MD Initiated Contact with Patient 12/06/22 1841     (approximate)   History   Chief Complaint Abdominal Pain   HPI  Christina Jordan is a 72 y.o. female with past medical history of hypertension, hyperlipidemia, migraines, and venous insufficiency who presents to the ED complaining of abdominal pain.  Patient reports that she has been dealing with intermittent pain in the right upper quadrant of her abdomen for the past week, which has become constant over the course of the day today.  It does not seem to be exacerbated or alleviated by anything in particular, she reports occasionally feeling nauseous but has not vomited.  Pain has not been associated with eating or drinking and she denies any changes in her bowel movements.  She has not had any fevers, dysuria, or flank pain.  She denies any history of similar symptoms, only abdominal surgical history is hysterectomy.  She does report some blood in her stool for 3 days earlier in the week, but has not noticed any blood over the past 2 days.  She does not take a blood thinner.     Physical Exam   Triage Vital Signs: ED Triage Vitals  Encounter Vitals Group     BP 12/06/22 1622 (!) 147/83     Systolic BP Percentile --      Diastolic BP Percentile --      Pulse Rate 12/06/22 1622 84     Resp 12/06/22 1622 18     Temp 12/06/22 1622 98.2 F (36.8 C)     Temp Source 12/06/22 1622 Oral     SpO2 12/06/22 1622 100 %     Weight 12/06/22 1622 215 lb (97.5 kg)     Height 12/06/22 1622 5\' 7"  (1.702 m)     Head Circumference --      Peak Flow --      Pain Score 12/06/22 1625 9     Pain Loc --      Pain Education --      Exclude from Growth Chart --     Most recent vital signs: Vitals:   12/06/22 1622  BP: (!) 147/83  Pulse: 84  Resp: 18  Temp: 98.2 F (36.8 C)  SpO2: 100%    Constitutional: Alert and oriented. Eyes: Conjunctivae are normal. Head:  Atraumatic. Nose: No congestion/rhinnorhea. Mouth/Throat: Mucous membranes are moist.  Cardiovascular: Normal rate, regular rhythm. Grossly normal heart sounds.  2+ radial pulses bilaterally. Respiratory: Normal respiratory effort.  No retractions. Lungs CTAB. Gastrointestinal: Soft and tender to palpation in the right upper quadrant with no rebound or guarding. No distention. Musculoskeletal: No lower extremity tenderness nor edema.  Neurologic:  Normal speech and language. No gross focal neurologic deficits are appreciated.    ED Results / Procedures / Treatments   Labs (all labs ordered are listed, but only abnormal results are displayed) Labs Reviewed  COMPREHENSIVE METABOLIC PANEL  CBC  LIPASE, BLOOD  TYPE AND SCREEN   RADIOLOGY Read for quadrant ultrasound reviewed and interpreted by me with no gallstones or wall thickening.  PROCEDURES:  Critical Care performed: No  Procedures   MEDICATIONS ORDERED IN ED: Medications - No data to display   IMPRESSION / MDM / ASSESSMENT AND PLAN / ED COURSE  I reviewed the triage vital signs and the nursing notes.  72 y.o. female with past medical history of hypertension, hyperlipidemia, migraines, and venous insufficiency who presents to the ED complaining of 1 week of intermittent pain in the right upper quadrant of her abdomen now with constant pain over the past 24 hours.  Patient's presentation is most consistent with acute presentation with potential threat to life or bodily function.  Differential diagnosis includes, but is not limited to, biliary colic, cholecystitis, pancreatitis, hepatitis, gastritis, colitis, lower GI bleed, UTI, kidney stone.  Patient nontoxic-appearing and in no acute distress, vital signs are unremarkable.  Her abdomen is soft but she does have focal tenderness in the right upper quadrant, will further assess with ultrasound.  Labs are reassuring with no significant anemia,  leukocytosis, electrolyte abnormality, or AKI.  LFTs and lipase are also unremarkable.  Patient does report bleeding earlier in the week but has not had any in the past 2 days, with stable H&H, doubt significant GI bleeding at this time.  Patient declines pain or nausea medication, will reassess following ultrasound.  Patient turned over to oncoming rider pending right upper quadrant [place] results.      FINAL CLINICAL IMPRESSION(S) / ED DIAGNOSES   Final diagnoses:  Right upper quadrant abdominal pain     Rx / DC Orders   ED Discharge Orders     None        Note:  This document was prepared using Dragon voice recognition software and may include unintentional dictation errors.   Chesley Noon, MD 12/07/22 [phone]

## 2022-12-06 NOTE — ED Provider Notes (Signed)
Assumed patient care from Chesley Noon, MD.  CBC, CMP and lipase reassuring.  Right upper quadrant ultrasound indicates gallbladder polyps that appear benign but no other concerning findings for cholecystitis.  Results were communicated to patient and husband and I recommended follow-up with primary care as needed.  Recommended Tylenol and ibuprofen alternating for discomfort.  All patient questions were answered.   Pia Mau [place], Cordelia Poche 12/06/22 2236    Chesley Noon, MD 12/07/22 [phone]

## 2022-12-06 NOTE — Discharge Instructions (Addendum)
Return to the emergency department for reevaluation if symptoms worsen at home.

## 2022-12-06 NOTE — ED Triage Notes (Signed)
Pt to ED via [facility] from UC. Pt reports RUQ pain x1 wk. Pt also reports several episodes of blood in stool. Pt denies N/V/D.

## 2022-12-06 NOTE — ED Notes (Signed)
Patient is being discharged from the Urgent Care and sent to the Emergency Department via POV . Per Dr. Rachael Darby, patient is in need of higher level of care due to Rectal Bleeding and Right upper quad pain. Patient is aware and verbalizes understanding of plan of care.  Vitals:   12/06/22 1501  BP: 119/74  Pulse: 81  Resp: 18  Temp: 98.2 F (36.8 C)  SpO2: 100%

## 2022-12-06 NOTE — ED Provider Notes (Signed)
[facility]    CSN: [id number] Arrival date & time: 12/06/22  1426      History   Chief Complaint Chief Complaint  Patient presents with   Flank Pain    HPI Mozel DEBRO HILT is a 72 y.o. female.   HPI  Shiana presents for right flank pain that radiates to her upper abdomen on 11/26/22. Has had some back pain. Pain described as sharp and felt like she couldn't get a deep breathe.    She started having pain in her rectal but was sitting on a wall at the baseball game but that was maybe 20 minutes. Had bright red blood on Sunday. Had blood in the toilet bowl that turned the water reddish-orange. She had a blood up to her waistband when she wiped. Monday and Tuesday she had blood when she wiped. No history of bleeding. Had a non-blood bowel movement today.  Mom had colon cancer. Zula's last colonoscopy was 5 years ago and believes she is due for another one. She feels like she gets full quick.    Past Surgeries: hysterectomy   Symptoms Nausea/Vomiting: yes, nausea  Diarrhea: yes, non-watery  Constipation: no  Melena/BRBPR: yes BRBPR Hematemesis: no  Anorexia: no  Fever/Chills: no  Dysuria: no  Rash: no  Wt loss: no  EtOH use: no  NSAIDs/ASA: no  Vaginal bleeding: no  Sore throat: no   Cough: no Sleep disturbance: yes  Back Pain: yes Headache: no   Past Medical History:  Diagnosis Date   Complication of anesthesia    hard to wake after hysterectomy   GERD (gastroesophageal reflux disease)    Headache    High cholesterol    Hypertension    Rosacea     Patient Active Problem List   Diagnosis Date Noted   Chronic venous insufficiency 10/30/2021   Hypertension 01/11/2014   Hyperlipidemia 01/11/2014   Migraine 01/11/2014   Rosacea 01/11/2014   Esophageal reflux 10/27/2013   Vitamin D deficiency 10/27/2013    Past Surgical History:  Procedure Laterality Date   ABDOMINAL HYSTERECTOMY     CATARACT EXTRACTION W/PHACO Right 10/10/2018   Procedure:  CATARACT EXTRACTION PHACO AND INTRAOCULAR LENS PLACEMENT (IOC)  RIGHT;  Surgeon: Nevada Crane, MD;  Location: [facility];  Service: Ophthalmology;  Laterality: Right;   CATARACT EXTRACTION W/PHACO Left 11/07/2018   Procedure: CATARACT EXTRACTION PHACO AND INTRAOCULAR LENS PLACEMENT (IOC)  LEFT;  Surgeon: Nevada Crane, MD;  Location: [facility];  Service: Ophthalmology;  Laterality: Left;   COLONOSCOPY     COLONOSCOPY WITH PROPOFOL N/A 09/20/2017   Procedure: COLONOSCOPY WITH PROPOFOL;  Surgeon: Scot Jun, MD;  Location: [facility];  Service: Endoscopy;  Laterality: N/A;   WISDOM TOOTH EXTRACTION      OB History   No obstetric history on file.      Home Medications    Prior to Admission medications   Medication Sig Start Date End Date Taking? Authorizing Provider  atorvastatin (LIPITOR) 10 MG tablet Take 10 mg by mouth daily.   Yes [provider]  calcium-vitamin D (OSCAL WITH D) 500-200 MG-UNIT tablet Take 1 tablet by mouth daily.   Yes [provider]  hydrochlorothiazide (HYDRODIURIL) 25 MG tablet Take 25 mg by mouth daily.   Yes [provider]  lisinopril (PRINIVIL,ZESTRIL) 10 MG tablet Take 10 mg by mouth daily.   Yes [provider]    Family History Family History  Problem Relation Age of Onset  Breast cancer Neg Hx     Social History Social History   Tobacco Use   Smoking status: Former    Current packs/day: 0.00    Types: Cigarettes    Quit date: 05/02/1997    Years since quitting: 25.6   Smokeless tobacco: Never  Vaping Use   Vaping status: Never Used  Substance Use Topics   Alcohol use: No    Comment: may have drink 2-3x/yr   Drug use: No     Allergies   Codeine   Review of Systems Review of Systems :negative unless otherwise stated in HPI.      Physical Exam Triage Vital Signs ED Triage Vitals  Encounter Vitals Group     BP 12/06/22 1501 119/74     Systolic BP  Percentile --      Diastolic BP Percentile --      Pulse Rate 12/06/22 1501 81     Resp 12/06/22 1501 18     Temp 12/06/22 1501 98.2 F (36.8 C)     Temp src --      SpO2 12/06/22 1501 100 %     Weight --      Height --      Head Circumference --      Peak Flow --      Pain Score 12/06/22 1502 8     Pain Loc --      Pain Education --      Exclude from Growth Chart --    No data found.  Updated Vital Signs BP 119/74   Pulse 81   Temp 98.2 F (36.8 C)   Resp 18   SpO2 100%   Visual Acuity Right Eye Distance:   Left Eye Distance:   Bilateral Distance:    Right Eye Near:   Left Eye Near:    Bilateral Near:     Physical Exam  GEN: pleasant  female, in no acute distress  RESP: no increased work of breathing ABD: Bowel sounds present. Soft, RUQ tenderness, positive Murphy sign, non-distended.  SKIN: warm, dry, no rash on visible skin   UC Treatments / Results  Labs (all labs ordered are listed, but only abnormal results are displayed) Labs Reviewed - No data to display  EKG  If EKG performed, see my interpretation and MDM section  Radiology No results found.   Procedures Procedures (including critical care time)  Medications Ordered in UC Medications - No data to display  Initial Impression / Assessment and Plan / UC Course  I have reviewed the triage vital signs and the nursing notes.  Pertinent labs & imaging results that were available during my care of the patient were reviewed by me and considered in my medical decision making (see chart for details).       Patient is a  72 y.o. female with history HTN, HLD, esophageal reflux and chronic venous insufficiency who presents after having insidious BRPR and right upper abdominal pain that started 5 days ago.  Overall, patient is well-appearing, well-hydrated, and in no acute distress.  Vital signs stable.  Jesicca is afebrile.    On chart review, Pt had a colonoscopy by Dr Markham Jordan on 09/20/17 that showed  internal hemorrhoids and diverticulosis of the sigmoid colon. It was recommended that she have a colonoscopy in 5 years. Her mom had colon cancer and multiple of her family members had to have their gall bladder removed.    On exam, she had RUQ tenderness with a positive  Murphy sign concerning for cholecystitis.  Urgent care work-up truncated.  Recommended ED evaluation to better delineate the etiology of her BRBPR and pain. Pt is agreeable. She is stable to discharge to [facility] ED via private vehicle.  Called and spoke with the first RN at [facility] ED who will await pts arrival.    Discussed MDM, treatment plan and plan for follow-up with patient who agrees with plan.    Final Clinical Impressions(s) / UC Diagnoses   Final diagnoses:  Right flank pain  BRBPR (bright red blood per rectum)  Abdominal pain, right upper quadrant     Discharge Instructions      You have been advised to follow up immediately in the emergency department for concerning signs or symptoms as discussed during your visit. If you declined EMS transport, please have a family member take you directly to the ED at this time. Do not delay.   Based on concerns about condition, if you do not follow up in the ED, you may risk poor outcomes including worsening of condition, delayed treatment and potentially life threatening issues. If you have declined to go to the ED at this time, you should call your PCP immediately to set up a follow up appointment.        ED Prescriptions   None    PDMP not reviewed this encounter.   Katha Cabal, DO 12/06/22 1605

## 2022-12-12 ENCOUNTER — Telehealth: Payer: Self-pay

## 2022-12-12 NOTE — Telephone Encounter (Signed)
Transition Care Management Unsuccessful Follow-up Telephone Call  Date of discharge and from where:  [place] 8/15  Attempts:  1st Attempt  Reason for unsuccessful TCM follow-up call:  No answer/busy   Lenard Forth Polk  [facility], [facility] Guide, Phone: [phone] Website: [email or web address]

## 2022-12-13 ENCOUNTER — Telehealth: Payer: Self-pay

## 2022-12-13 NOTE — Telephone Encounter (Signed)
Transition Care Management Unsuccessful Follow-up Telephone Call  Date of discharge and from where:  [place] 8/15  Attempts:  2nd Attempt  Reason for unsuccessful TCM follow-up call:  No answer/busy   Lenard Forth Callender  [facility], [facility] Guide, Phone: [phone] Website: [email or web address]

## 2023-02-20 DIAGNOSIS — R1011 Right upper quadrant pain: Secondary | ICD-10-CM | POA: Diagnosis not present

## 2023-02-20 DIAGNOSIS — K6289 Other specified diseases of anus and rectum: Secondary | ICD-10-CM | POA: Diagnosis not present

## 2023-02-20 DIAGNOSIS — K625 Hemorrhage of anus and rectum: Secondary | ICD-10-CM | POA: Diagnosis not present

## 2023-02-20 DIAGNOSIS — K602 Anal fissure, unspecified: Secondary | ICD-10-CM | POA: Diagnosis not present

## 2023-02-25 DIAGNOSIS — G43109 Migraine with aura, not intractable, without status migrainosus: Secondary | ICD-10-CM | POA: Diagnosis not present

## 2023-02-25 DIAGNOSIS — K219 Gastro-esophageal reflux disease without esophagitis: Secondary | ICD-10-CM | POA: Diagnosis not present

## 2023-02-25 DIAGNOSIS — Z0000 Encounter for general adult medical examination without abnormal findings: Secondary | ICD-10-CM | POA: Diagnosis not present

## 2023-02-25 DIAGNOSIS — I10 Essential (primary) hypertension: Secondary | ICD-10-CM | POA: Diagnosis not present

## 2023-02-25 DIAGNOSIS — E7800 Pure hypercholesterolemia, unspecified: Secondary | ICD-10-CM | POA: Diagnosis not present

## 2023-02-25 DIAGNOSIS — Z1331 Encounter for screening for depression: Secondary | ICD-10-CM | POA: Diagnosis not present

## 2023-02-27 DIAGNOSIS — Z6834 Body mass index (BMI) 34.0-34.9, adult: Secondary | ICD-10-CM | POA: Diagnosis not present

## 2023-02-27 DIAGNOSIS — E669 Obesity, unspecified: Secondary | ICD-10-CM | POA: Diagnosis not present

## 2023-02-27 DIAGNOSIS — E785 Hyperlipidemia, unspecified: Secondary | ICD-10-CM | POA: Diagnosis not present

## 2023-02-27 DIAGNOSIS — Z809 Family history of malignant neoplasm, unspecified: Secondary | ICD-10-CM | POA: Diagnosis not present

## 2023-02-27 DIAGNOSIS — K219 Gastro-esophageal reflux disease without esophagitis: Secondary | ICD-10-CM | POA: Diagnosis not present

## 2023-02-27 DIAGNOSIS — I10 Essential (primary) hypertension: Secondary | ICD-10-CM | POA: Diagnosis not present

## 2023-05-27 ENCOUNTER — Ambulatory Visit
Admission: RE | Admit: 2023-05-27 | Discharge: 2023-05-27 | Disposition: A | Discharge status: Home / Self Care | Payer: Medicare PPO | Source: Ambulatory Visit | Attending: Gastroenterology | Admitting: Gastroenterology

## 2023-05-27 ENCOUNTER — Encounter: Payer: Self-pay | Admitting: *Deleted

## 2023-05-27 ENCOUNTER — Ambulatory Visit: Payer: Medicare PPO | Admitting: Anesthesiology

## 2023-05-27 ENCOUNTER — Encounter
Admission: RE | Disposition: A | Discharge status: Home / Self Care | Payer: Self-pay | Source: Ambulatory Visit | Attending: Gastroenterology

## 2023-05-27 DIAGNOSIS — Z79899 Other long term (current) drug therapy: Secondary | ICD-10-CM | POA: Insufficient documentation

## 2023-05-27 DIAGNOSIS — K319 Disease of stomach and duodenum, unspecified: Secondary | ICD-10-CM | POA: Insufficient documentation

## 2023-05-27 DIAGNOSIS — K635 Polyp of colon: Secondary | ICD-10-CM | POA: Diagnosis not present

## 2023-05-27 DIAGNOSIS — K5730 Diverticulosis of large intestine without perforation or abscess without bleeding: Secondary | ICD-10-CM | POA: Insufficient documentation

## 2023-05-27 DIAGNOSIS — K219 Gastro-esophageal reflux disease without esophagitis: Secondary | ICD-10-CM | POA: Insufficient documentation

## 2023-05-27 DIAGNOSIS — R1011 Right upper quadrant pain: Secondary | ICD-10-CM | POA: Insufficient documentation

## 2023-05-27 DIAGNOSIS — D123 Benign neoplasm of transverse colon: Secondary | ICD-10-CM | POA: Insufficient documentation

## 2023-05-27 DIAGNOSIS — K3189 Other diseases of stomach and duodenum: Secondary | ICD-10-CM | POA: Diagnosis not present

## 2023-05-27 DIAGNOSIS — E785 Hyperlipidemia, unspecified: Secondary | ICD-10-CM | POA: Insufficient documentation

## 2023-05-27 DIAGNOSIS — Z800 Family history of malignant neoplasm of digestive organs: Secondary | ICD-10-CM | POA: Diagnosis not present

## 2023-05-27 DIAGNOSIS — I10 Essential (primary) hypertension: Secondary | ICD-10-CM | POA: Insufficient documentation

## 2023-05-27 DIAGNOSIS — R519 Headache, unspecified: Secondary | ICD-10-CM | POA: Insufficient documentation

## 2023-05-27 DIAGNOSIS — K625 Hemorrhage of anus and rectum: Secondary | ICD-10-CM | POA: Insufficient documentation

## 2023-05-27 DIAGNOSIS — Z87891 Personal history of nicotine dependence: Secondary | ICD-10-CM | POA: Insufficient documentation

## 2023-05-27 DIAGNOSIS — E7800 Pure hypercholesterolemia, unspecified: Secondary | ICD-10-CM | POA: Insufficient documentation

## 2023-05-27 DIAGNOSIS — K640 First degree hemorrhoids: Secondary | ICD-10-CM | POA: Diagnosis not present

## 2023-05-27 HISTORY — PX: POLYPECTOMY: SHX5525

## 2023-05-27 HISTORY — PX: BIOPSY: SHX5522

## 2023-05-27 HISTORY — PX: ESOPHAGOGASTRODUODENOSCOPY (EGD) WITH PROPOFOL: SHX5813

## 2023-05-27 HISTORY — PX: COLONOSCOPY WITH PROPOFOL: SHX5780

## 2023-05-27 SURGERY — COLONOSCOPY WITH PROPOFOL
Anesthesia: General

## 2023-05-27 MED ORDER — PROPOFOL 500 MG/50ML IV EMUL
INTRAVENOUS | Status: DC | PRN
  Administered 2023-05-27: 155 ug/kg/min via INTRAVENOUS

## 2023-05-27 MED ORDER — PROPOFOL 10 MG/ML IV BOLUS
INTRAVENOUS | Status: DC | PRN
Start: 2023-05-27 — End: 2023-05-27
  Administered 2023-05-27: 10 mg via INTRAVENOUS
  Administered 2023-05-27: 60 mg via INTRAVENOUS
  Administered 2023-05-27: 10 mg via INTRAVENOUS

## 2023-05-27 MED ORDER — SODIUM CHLORIDE 0.9 % IV SOLN: INTRAVENOUS | Status: DC

## 2023-05-27 MED ORDER — LIDOCAINE HCL (CARDIAC) PF 100 MG/5ML IV SOSY
PREFILLED_SYRINGE | INTRAVENOUS | Status: DC | PRN
  Administered 2023-05-27: 100 mg via INTRAVENOUS

## 2023-05-27 NOTE — Op Note (Signed)
[facility] Patient Name: Christina Jordan Procedure Date: 05/27/2023 8:37 AM MRN: [id number] Account #: [id number] Date of Birth: [birth year] Admit Type: Outpatient Age: 73 Room: [facility] ENDO ROOM 3 Gender: Female Note Status: Finalized Instrument Name: Upper Endoscope [id number] Procedure:             Upper GI endoscopy Indications:           Abdominal pain in the right upper quadrant Providers:             Eather Colas MD, MD Referring MD:          Marina Goodell (Referring MD) Medicines:             Monitored Anesthesia Care Complications:         No immediate complications. Estimated blood loss:                         Minimal. Procedure:             Pre-Anesthesia Assessment:                        - Prior to the procedure, a History and Physical was                         performed, and patient medications and allergies were                         reviewed. The patient is competent. The risks and                         benefits of the procedure and the sedation options and                         risks were discussed with the patient. All questions                         were answered and informed consent was obtained.                         Patient identification and proposed procedure were                         verified by the physician, the nurse, the                         anesthesiologist, the anesthetist and the technician                         in the endoscopy suite. Mental Status Examination:                         alert and oriented. Airway Examination: normal                         oropharyngeal airway and neck mobility. Respiratory                         Examination: clear to auscultation. CV Examination:  normal. Prophylactic Antibiotics: The patient does not                         require prophylactic antibiotics. Prior                         Anticoagulants: The patient has taken no  anticoagulant                         or antiplatelet agents. ASA Grade Assessment: III - A                         patient with severe systemic disease. After reviewing                         the risks and benefits, the patient was deemed in                         satisfactory condition to undergo the procedure. The                         anesthesia plan was to use monitored anesthesia care                         (MAC). Immediately prior to administration of                         medications, the patient was re-assessed for adequacy                         to receive sedatives. The heart rate, respiratory                         rate, oxygen saturations, blood pressure, adequacy of                         pulmonary ventilation, and response to care were                         monitored throughout the procedure. The physical                         status of the patient was re-assessed after the                         procedure.                        After obtaining informed consent, the endoscope was                         passed under direct vision. Throughout the procedure,                         the patient's blood pressure, pulse, and oxygen                         saturations were monitored continuously. The Endoscope  was introduced through the mouth, and advanced to the                         second part of duodenum. The upper GI endoscopy was                         accomplished without difficulty. The patient tolerated                         the procedure well. Findings:      The examined esophagus was normal.      The entire examined stomach was normal. Biopsies were taken with a cold       forceps for Helicobacter pylori testing. Estimated blood loss was       minimal.      The examined duodenum was normal. Impression:            - Normal esophagus.                        - Normal stomach. Biopsied.                        - Normal examined  duodenum. Recommendation:        - Discharge patient to home.                        - Resume previous diet.                        - Continue present medications.                        - Await pathology results.                        - Return to referring physician as previously                         scheduled. Procedure Code(s):     --- Professional ---                        [phone], Esophagogastroduodenoscopy, flexible,                         transoral; with biopsy, single or multiple Diagnosis Code(s):     --- Professional ---                        R10.11, Right upper quadrant pain CPT copyright 2022 American Medical Association. All rights reserved. The codes documented in this report are preliminary and upon coder review may  be revised to meet current compliance requirements. Eather Colas MD, MD 05/27/2023 9:04:42 AM Number of Addenda: 0 Note Initiated On: 05/27/2023 8:37 AM Estimated Blood Loss:  Estimated blood loss was minimal.      [facility]

## 2023-05-27 NOTE — Interval H&P Note (Signed)
History and Physical Interval Note:  05/27/2023 8:35 AM  Christina Jordan  has presented today for surgery, with the diagnosis of RECTAL BLEEDING,RECTAL PAIN,RUQ PAIN.  The various methods of treatment have been discussed with the patient and family. After consideration of risks, benefits and other options for treatment, the patient has consented to  Procedure(s): COLONOSCOPY WITH PROPOFOL (N/A) ESOPHAGOGASTRODUODENOSCOPY (EGD) WITH PROPOFOL (N/A) as a surgical intervention.  The patient's history has been reviewed, patient examined, no change in status, stable for surgery.  I have reviewed the patient's chart and labs.  Questions were answered to the patient's satisfaction.     [name]  Ok to proceed with EGD/Colonoscopy

## 2023-05-27 NOTE — H&P (Signed)
Outpatient short stay form Pre-procedure 05/27/2023  [name], MD  Primary Physician: Marina Goodell, MD  Reason for visit:  Abdominal pain/Rectal bleeding  History of present illness:    73 y/o lady with history of hypertension and HLD here for EGD/Colonoscopy for abdominal pain and rectal bleeding. Had anal fissure in clinic per note. Mother had colon cancer in her 63's. No blood thinners. History of hysterectomy.    Current Facility-Administered Medications:    0.9 %  sodium chloride infusion, , Intravenous, Continuous, Kalep Full, Rossie Muskrat, MD  Medications Prior to Admission  Medication Sig Dispense Refill Last Dose/Taking   atorvastatin (LIPITOR) 10 MG tablet Take 10 mg by mouth daily.   05/26/2023   calcium-vitamin D (OSCAL WITH D) 500-200 MG-UNIT tablet Take 1 tablet by mouth daily.   05/26/2023   hydrochlorothiazide (HYDRODIURIL) 25 MG tablet Take 25 mg by mouth daily.   05/27/2023 at  4:15 AM   lisinopril (PRINIVIL,ZESTRIL) 10 MG tablet Take 10 mg by mouth daily.   05/27/2023 at  4:15 AM     Allergies  Allergen Reactions   Codeine Itching     Past Medical History:  Diagnosis Date   Complication of anesthesia    hard to wake after hysterectomy   GERD (gastroesophageal reflux disease)    Headache    High cholesterol    Hypertension    Rosacea     Review of systems:  Otherwise negative.    Physical Exam  Gen: Alert, oriented. Appears stated age.  HEENT: PERRLA. Lungs: No respiratory distress CV: RRR Abd: soft, benign, no masses Ext: No edema    Planned procedures: Proceed with EGD/colonoscopy. The patient understands the nature of the planned procedure, indications, risks, alternatives and potential complications including but not limited to bleeding, infection, perforation, damage to internal organs and possible oversedation/side effects from anesthesia. The patient agrees and gives consent to proceed.  Please refer to procedure notes for findings,  recommendations and patient disposition/instructions.     [name], MD [facility] [place] [facility]

## 2023-05-27 NOTE — Op Note (Signed)
[facility] Gastroenterology Patient Name: Christina Jordan Procedure Date: 05/27/2023 8:36 AM MRN: [id number] Account #: [id number] Date of Birth: Feb 14, 1951 Admit Type: Outpatient Age: 73 Room: [facility] ENDO ROOM 3 Gender: Female Note Status: Finalized Instrument Name: Colonoscope [id number] Procedure:             Colonoscopy Indications:           Rectal bleeding Providers:             Eather Colas MD, MD Referring MD:          Marina Goodell (Referring MD) Medicines:             Monitored Anesthesia Care Complications:         No immediate complications. Estimated blood loss:                         Minimal. Procedure:             Pre-Anesthesia Assessment:                        - Prior to the procedure, a History and Physical was                         performed, and patient medications and allergies were                         reviewed. The patient is competent. The risks and                         benefits of the procedure and the sedation options and                         risks were discussed with the patient. All questions                         were answered and informed consent was obtained.                         Patient identification and proposed procedure were                         verified by the physician, the nurse, the                         anesthesiologist, the anesthetist and the technician                         in the endoscopy suite. Mental Status Examination:                         alert and oriented. Airway Examination: normal                         oropharyngeal airway and neck mobility. Respiratory                         Examination: clear to auscultation. CV Examination:  normal. Prophylactic Antibiotics: The patient does not                         require prophylactic antibiotics. Prior                         Anticoagulants: The patient has taken no anticoagulant                         or  antiplatelet agents. ASA Grade Assessment: III - A                         patient with severe systemic disease. After reviewing                         the risks and benefits, the patient was deemed in                         satisfactory condition to undergo the procedure. The                         anesthesia plan was to use monitored anesthesia care                         (MAC). Immediately prior to administration of                         medications, the patient was re-assessed for adequacy                         to receive sedatives. The heart rate, respiratory                         rate, oxygen saturations, blood pressure, adequacy of                         pulmonary ventilation, and response to care were                         monitored throughout the procedure. The physical                         status of the patient was re-assessed after the                         procedure.                        After obtaining informed consent, the colonoscope was                         passed under direct vision. Throughout the procedure,                         the patient's blood pressure, pulse, and oxygen                         saturations were monitored continuously. The  Colonoscope was introduced through the anus and                         advanced to the the terminal ileum, with                         identification of the appendiceal orifice and IC                         valve. The colonoscopy was performed without                         difficulty. The patient tolerated the procedure well.                         The quality of the bowel preparation was adequate to                         identify polyps. The terminal ileum, ileocecal valve,                         appendiceal orifice, and rectum were photographed. Findings:      The perianal and digital rectal examinations were normal.      The terminal ileum appeared normal.      A 3 mm  polyp was found in the transverse colon. The polyp was sessile.       The polyp was removed with a cold snare. Resection and retrieval were       complete. Estimated blood loss was minimal.      A few small-mouthed diverticula were found in the sigmoid colon.      Internal hemorrhoids were found during retroflexion. The hemorrhoids       were Grade I (internal hemorrhoids that do not prolapse).      The exam was otherwise without abnormality on direct and retroflexion       views. Impression:            - The examined portion of the ileum was normal.                        - One 3 mm polyp in the transverse colon, removed with                         a cold snare. Resected and retrieved.                        - Diverticulosis in the sigmoid colon.                        - Internal hemorrhoids.                        - The examination was otherwise normal on direct and                         retroflexion views. Recommendation:        - Discharge patient to home.                        - Resume previous  diet.                        - Continue present medications.                        - Await pathology results.                        - Repeat colonoscopy in 5 years for surveillance.                        - Return to referring physician as previously                         scheduled. Procedure Code(s):     --- Professional ---                        [phone], Colonoscopy, flexible; with removal of                         tumor(s), polyp(s), or other lesion(s) by snare                         technique Diagnosis Code(s):     --- Professional ---                        K64.0, First degree hemorrhoids                        D12.3, Benign neoplasm of transverse colon (hepatic                         flexure or splenic flexure)                        K62.5, Hemorrhage of anus and rectum                        K57.30, Diverticulosis of large intestine without                          perforation or abscess without bleeding CPT copyright 2022 American [employer] Association. All rights reserved. The codes documented in this report are preliminary and upon coder review may  be revised to meet current compliance requirements. Eather Colas MD, MD 05/27/2023 9:08:48 AM Number of Addenda: 0 Note Initiated On: 05/27/2023 8:36 AM Scope Withdrawal Time: 0 hours 7 minutes 3 seconds  Total Procedure Duration: 0 hours 11 minutes 16 seconds  Estimated Blood Loss:  Estimated blood loss was minimal.      [facility]

## 2023-05-27 NOTE — Anesthesia Preprocedure Evaluation (Signed)
Anesthesia Evaluation  Patient identified by MRN, date of birth, ID band Patient awake    Reviewed: Allergy & Precautions, H&P , NPO status , Patient's Chart, lab work & pertinent test results  History of Anesthesia Complications Negative for: history of anesthetic complications  Airway Mallampati: III  TM Distance: <3 FB Neck ROM: limited    Dental  (+) Chipped, Poor Dentition, Missing, Dental Advidsory Given   Pulmonary neg shortness of breath, asthma , neg recent URI, former smoker          Cardiovascular Exercise Tolerance: Good hypertension, (-) angina (-) Past MI, (-) Cardiac Stents and (-) DOE (-) dysrhythmias (-) Valvular Problems/Murmurs     Neuro/Psych  Headaches, neg Seizures  negative psych ROS   GI/Hepatic Neg liver ROS,GERD  Medicated and Controlled,,  Endo/Other  negative endocrine ROS    Renal/GU negative Renal ROS  negative genitourinary   Musculoskeletal   Abdominal   Peds  Hematology negative hematology ROS (+)   Anesthesia Other Findings Past Medical History: No date: GERD (gastroesophageal reflux disease) No date: Headache No date: High cholesterol No date: Hypertension No date: Rosacea  Past Surgical History: No date: ABDOMINAL HYSTERECTOMY No date: COLONOSCOPY No date: WISDOM TOOTH EXTRACTION  BMI    Body Mass Index:  31.32 kg/m      Reproductive/Obstetrics negative OB ROS                             Anesthesia Physical Anesthesia Plan  ASA: 3  Anesthesia Plan: General   Post-op Pain Management:    Induction: Intravenous  PONV Risk Score and Plan: 3 and Propofol infusion, TIVA and Treatment may vary due to age or medical condition  Airway Management Planned: Natural Airway and Nasal Cannula  Additional Equipment:   Intra-op Plan:   Post-operative Plan:   Informed Consent: I have reviewed the patients History and Physical, chart, labs and  discussed the procedure including the risks, benefits and alternatives for the proposed anesthesia with the patient or authorized representative who has indicated his/her understanding and acceptance.     Dental Advisory Given  Plan Discussed with: Anesthesiologist, CRNA and Surgeon  Anesthesia Plan Comments: (Patient consented for risks of anesthesia including but not limited to:  - adverse reactions to medications - risk of intubation if required - damage to teeth, lips or other oral mucosa - sore throat or hoarseness - Damage to heart, brain, lungs or loss of life  Patient voiced understanding.)        Anesthesia Quick Evaluation

## 2023-05-27 NOTE — Anesthesia Procedure Notes (Signed)
Procedure Name: General with mask airway Date/Time: 05/27/2023 8:44 AM  Performed by: Mohammed Kindle, CRNAPre-anesthesia Checklist: Patient identified, Emergency Drugs available, Suction available and Patient being monitored Patient Re-evaluated:Patient Re-evaluated prior to induction Oxygen Delivery Method: Simple face mask Induction Type: IV induction Placement Confirmation: positive ETCO2 and breath sounds checked- equal and bilateral Dental Injury: Teeth and Oropharynx as per pre-operative assessment

## 2023-05-27 NOTE — Transfer of Care (Signed)
Immediate Anesthesia Transfer of Care Note  Patient: Christina Jordan  Procedure(s) Performed: COLONOSCOPY WITH PROPOFOL ESOPHAGOGASTRODUODENOSCOPY (EGD) WITH PROPOFOL BIOPSY POLYPECTOMY  Patient Location: Endoscopy Unit  Anesthesia Type:General  Level of Consciousness: drowsy and patient cooperative  Airway & Oxygen Therapy: Patient Spontanous Breathing and Patient connected to face mask oxygen  Post-op Assessment: Report given to RN and Post -op Vital signs reviewed and stable  Post vital signs: Reviewed and stable  Last Vitals:  Vitals Value Taken Time  BP 97/60 05/27/23 0904  Temp 36.2 C 05/27/23 0904  Pulse 80 05/27/23 0905  Resp 15 05/27/23 0905  SpO2 100 % 05/27/23 0905  Vitals shown include unfiled device data.  Last Pain:  Vitals:   05/27/23 0904  TempSrc: Tympanic  PainSc: Asleep         Complications: No notable events documented.

## 2023-05-28 NOTE — Anesthesia Postprocedure Evaluation (Signed)
 Anesthesia Post Note  Patient: Christina Jordan  Procedure(s) Performed: COLONOSCOPY WITH PROPOFOL  ESOPHAGOGASTRODUODENOSCOPY (EGD) WITH PROPOFOL  BIOPSY POLYPECTOMY  Patient location during evaluation: Endoscopy Anesthesia Type: General Level of consciousness: awake and alert Pain management: pain level controlled Vital Signs Assessment: post-procedure vital signs reviewed and stable Respiratory status: spontaneous breathing, nonlabored ventilation, respiratory function stable and patient connected to nasal cannula oxygen Cardiovascular status: blood pressure returned to baseline and stable Postop Assessment: no apparent nausea or vomiting Anesthetic complications: no   No notable events documented.   Last Vitals:  Vitals:   05/27/23 0914 05/27/23 0924  BP: (!) 91/58 112/75  Pulse: 75 72  Resp: 14 15  Temp:    SpO2: 100% 100%    Last Pain:  Vitals:   05/27/23 0924  TempSrc:   PainSc: 0-No pain                 Prentice Murphy

## 2023-05-29 ENCOUNTER — Encounter: Payer: Self-pay | Admitting: Gastroenterology

## 2023-05-29 LAB — SURGICAL PATHOLOGY

## 2023-07-03 ENCOUNTER — Ambulatory Visit
Admission: EM | Admit: 2023-07-03 | Discharge: 2023-07-03 | Disposition: A | Discharge status: Home / Self Care | Attending: Emergency Medicine | Admitting: Emergency Medicine

## 2023-07-03 ENCOUNTER — Ambulatory Visit (INDEPENDENT_AMBULATORY_CARE_PROVIDER_SITE_OTHER)

## 2023-07-03 DIAGNOSIS — R051 Acute cough: Secondary | ICD-10-CM | POA: Diagnosis not present

## 2023-07-03 DIAGNOSIS — R059 Cough, unspecified: Secondary | ICD-10-CM | POA: Diagnosis not present

## 2023-07-03 DIAGNOSIS — J069 Acute upper respiratory infection, unspecified: Secondary | ICD-10-CM | POA: Diagnosis not present

## 2023-07-03 DIAGNOSIS — R0989 Other specified symptoms and signs involving the circulatory and respiratory systems: Secondary | ICD-10-CM | POA: Diagnosis not present

## 2023-07-03 MED ORDER — AEROCHAMBER MV MISC: 2 refills | Status: AC

## 2023-07-03 MED ORDER — IPRATROPIUM BROMIDE 0.06 % NA SOLN: 2.0000 | Freq: Four times a day (QID) | NASAL | 12 refills | Status: AC

## 2023-07-03 MED ORDER — BENZONATATE 100 MG PO CAPS: 200.0000 mg | ORAL_CAPSULE | Freq: Three times a day (TID) | ORAL | 0 refills | Status: AC

## 2023-07-03 MED ORDER — ALBUTEROL SULFATE HFA 108 (90 BASE) MCG/ACT IN AERS: 2.0000 | INHALATION_SPRAY | RESPIRATORY_TRACT | 0 refills | Status: AC | PRN

## 2023-07-03 MED ORDER — AMOXICILLIN-POT CLAVULANATE 875-125 MG PO TABS: 1.0000 | ORAL_TABLET | Freq: Two times a day (BID) | ORAL | 0 refills | Status: AC

## 2023-07-03 MED ORDER — PROMETHAZINE-DM 6.25-15 MG/5ML PO SYRP: 5.0000 mL | ORAL_SOLUTION | Freq: Four times a day (QID) | ORAL | 0 refills | Status: AC | PRN

## 2023-07-03 NOTE — ED Triage Notes (Signed)
 Cough, congestion, body aches x 6 days. Taking aleve.

## 2023-07-03 NOTE — ED Provider Notes (Addendum)
 [facility]    CSN: [id number] Arrival date & time: 07/03/23  0950      History   Chief Complaint Chief Complaint  Patient presents with   Cough    HPI Christina Jordan is a 73 y.o. female.   HPI  73 year old female with past medical history significant for rosacea, hypertension, high cholesterol, GERD, and headaches presents for evaluation of 6 days worth of respiratory symptoms that include runny nose nasal congestion for yellow nasal discharge, subjective fever, productive cough for rusty looking sputum, body aches, shortness breath, and wheezing.  Past Medical History:  Diagnosis Date   Complication of anesthesia    hard to wake after hysterectomy   GERD (gastroesophageal reflux disease)    Headache    High cholesterol    Hypertension    Rosacea     Patient Active Problem List   Diagnosis Date Noted   Chronic venous insufficiency 10/30/2021   Hypertension 01/11/2014   Hyperlipidemia 01/11/2014   Migraine 01/11/2014   Rosacea 01/11/2014   Esophageal reflux 10/27/2013   Vitamin D deficiency 10/27/2013    Past Surgical History:  Procedure Laterality Date   ABDOMINAL HYSTERECTOMY     BIOPSY  05/27/2023   Procedure: BIOPSY;  Surgeon: [name], MD;  Location: [facility] ENDOSCOPY;  Service: Endoscopy;;   CATARACT EXTRACTION W/PHACO Right 10/10/2018   Procedure: CATARACT EXTRACTION PHACO AND INTRAOCULAR LENS PLACEMENT (IOC)  RIGHT;  Surgeon: Nevada Crane, MD;  Location: [facility];  Service: Ophthalmology;  Laterality: Right;   CATARACT EXTRACTION W/PHACO Left 11/07/2018   Procedure: CATARACT EXTRACTION PHACO AND INTRAOCULAR LENS PLACEMENT (IOC)  LEFT;  Surgeon: Nevada Crane, MD;  Location: [facility];  Service: Ophthalmology;  Laterality: Left;   COLONOSCOPY     COLONOSCOPY WITH PROPOFOL N/A 09/20/2017   Procedure: COLONOSCOPY WITH PROPOFOL;  Surgeon: Scot Jun, MD;  Location: [facility] ENDOSCOPY;  Service: Endoscopy;   Laterality: N/A;   COLONOSCOPY WITH PROPOFOL N/A 05/27/2023   Procedure: COLONOSCOPY WITH PROPOFOL;  Surgeon: [name], MD;  Location: [facility] ENDOSCOPY;  Service: Endoscopy;  Laterality: N/A;   ESOPHAGOGASTRODUODENOSCOPY (EGD) WITH PROPOFOL N/A 05/27/2023   Procedure: ESOPHAGOGASTRODUODENOSCOPY (EGD) WITH PROPOFOL;  Surgeon: [name], MD;  Location: [facility] ENDOSCOPY;  Service: Endoscopy;  Laterality: N/A;   POLYPECTOMY  05/27/2023   Procedure: POLYPECTOMY;  Surgeon: [name], MD;  Location: [facility] ENDOSCOPY;  Service: Endoscopy;;   WISDOM TOOTH EXTRACTION      OB History   No obstetric history on file.      Home Medications    Prior to Admission medications   Medication Sig Start Date End Date Taking? Authorizing Provider  albuterol (VENTOLIN HFA) 108 (90 Base) MCG/ACT inhaler Inhale 2 puffs into the lungs every 4 (four) hours as needed. 07/03/23  Yes Becky Augusta, NP  amoxicillin-clavulanate (AUGMENTIN) 875-125 MG tablet Take 1 tablet by mouth every 12 (twelve) hours for 7 days. 07/03/23 07/10/23 Yes Becky Augusta, NP  atorvastatin (LIPITOR) 10 MG tablet Take 10 mg by mouth daily.   Yes [provider]  benzonatate (TESSALON) 100 MG capsule Take 2 capsules (200 mg total) by mouth every 8 (eight) hours. 07/03/23  Yes Becky Augusta, NP  calcium-vitamin D (OSCAL WITH D) 500-200 MG-UNIT tablet Take 1 tablet by mouth daily.   Yes [provider]  hydrochlorothiazide (HYDRODIURIL) 25 MG tablet Take 25 mg by mouth daily.   Yes [provider]  ipratropium (ATROVENT) 0.06 % nasal spray  Place 2 sprays into both nostrils 4 (four) times daily. 07/03/23  Yes Becky Augusta, NP  lisinopril (PRINIVIL,ZESTRIL) 10 MG tablet Take 10 mg by mouth daily.   Yes [provider]  promethazine-dextromethorphan (PROMETHAZINE-DM) 6.25-15 MG/5ML syrup Take 5 mLs by mouth 4 (four) times daily as needed. 07/03/23  Yes Becky Augusta, NP  Spacer/Aero-Holding Deretha Emory  (AEROCHAMBER MV) inhaler Use as instructed 07/03/23  Yes Becky Augusta, NP    Family History Family History  Problem Relation Age of Onset   Breast cancer Neg Hx     Social History Social History   Tobacco Use   Smoking status: Former    Current packs/day: 0.00    Types: Cigarettes    Quit date: 05/02/1997    Years since quitting: 26.1   Smokeless tobacco: Never  Vaping Use   Vaping status: Never Used  Substance Use Topics   Alcohol use: No    Comment: may have drink 2-3x/yr   Drug use: No     Allergies   Codeine   Review of Systems Review of Systems  Constitutional:  Negative for fever.  HENT:  Positive for congestion and rhinorrhea. Negative for ear pain.   Respiratory:  Positive for cough, shortness of breath and wheezing.   Musculoskeletal:  Positive for arthralgias and myalgias.     Physical Exam Triage Vital Signs ED Triage Vitals  Encounter Vitals Group     BP 07/03/23 1222 (!) 140/83     Systolic BP Percentile --      Diastolic BP Percentile --      Pulse Rate 07/03/23 1221 87     Resp 07/03/23 1221 18     Temp 07/03/23 1221 98.5 F (36.9 C)     Temp Source 07/03/23 1221 Oral     SpO2 07/03/23 1221 98 %     Weight --      Height --      Head Circumference --      Peak Flow --      Pain Score 07/03/23 1223 0     Pain Loc --      Pain Education --      Exclude from Growth Chart --    No data found.  Updated Vital Signs BP (!) 140/83   Pulse 87   Temp 98.5 F (36.9 C) (Oral)   Resp 18   SpO2 98%   Visual Acuity Right Eye Distance:   Left Eye Distance:   Bilateral Distance:    Right Eye Near:   Left Eye Near:    Bilateral Near:     Physical Exam Vitals and nursing note reviewed.  Constitutional:      Appearance: Normal appearance. She is not ill-appearing.  HENT:     Head: Normocephalic and atraumatic.     Right Ear: Tympanic membrane, ear canal and external ear normal. There is no impacted cerumen.     Left Ear: Tympanic  membrane, ear canal and external ear normal. There is no impacted cerumen.     Nose: Congestion and rhinorrhea present.     Comments: Mucosa is erythematous and edematous with yellow discharge in both nares.    Mouth/Throat:     Mouth: Mucous membranes are moist.     Pharynx: Oropharynx is clear. No oropharyngeal exudate or posterior oropharyngeal erythema.  Cardiovascular:     Rate and Rhythm: Normal rate and regular rhythm.     Pulses: Normal pulses.     Heart sounds: Normal heart sounds. No  murmur heard.    No friction rub. No gallop.  Pulmonary:     Effort: Pulmonary effort is normal.     Breath sounds: Normal breath sounds. No wheezing, rhonchi or rales.  Musculoskeletal:     Cervical back: Normal range of motion and neck supple. No tenderness.  Lymphadenopathy:     Cervical: No cervical adenopathy.  Skin:    General: Skin is warm and dry.     Capillary Refill: Capillary refill takes less than 2 seconds.     Findings: No rash.  Neurological:     General: No focal deficit present.     Mental Status: She is alert and oriented to person, place, and time.      UC Treatments / Results  Labs (all labs ordered are listed, but only abnormal results are displayed) Labs Reviewed - No data to display  EKG   Radiology No results found.  Procedures Procedures (including critical care time)  Medications Ordered in UC Medications - No data to display  Initial Impression / Assessment and Plan / UC Course  I have reviewed the triage vital signs and the nursing notes.  Pertinent labs & imaging results that were available during my care of the patient were reviewed by me and considered in my medical decision making (see chart for details).   Patient is a pleasant, nontoxic-appearing 73 year old female presented for evaluation 6 days with respiratory symptoms outlined HPI above.  Her most significant simple is a productive cough that initially had yellow sputum now she describes  it as a "purpley".  Cardiopulmonary exam reveals clear lung sounds in all fields.  Will obtain a chest x-ray to eval for any acute cardiopulmonary allergy.  Given that she has had symptoms for 6 days, tested for COVID and flu at this time.  Chest x-ray independently reviewed and evaluated by me.  Impression: Lung fields are well aerated without evidence of infiltrate or effusion.  Cardiomediastinal silhouette appears normal.  Radiology overread is pending. Radiology impression states no active cardiopulmonary disease.  I will discharge patient home with a diagnosis of URI with cough and congestion on Augmentin 875 mg twice daily with food for 7 days.  Tessalon Perles and Promethazine DM cough syrup for cough and congestion.  I will also prescribe Atrovent nasal spray and nasal congestion.  Additionally, I will prescribe an albuterol inhaler and spacer that she can use every 4-6 hours as needed for prescription of breath or wheezing.   Final Clinical Impressions(s) / UC Diagnoses   Final diagnoses:  Acute cough  URI with cough and congestion     Discharge Instructions      Your chest x-ray did not show any evidence of pneumonia.  Your exam is consistent with an upper respiratory infection.  Due to the fact that you are having purulent sputum production a trial of antibiotics is warranted.  Take the Augmentin 875 mg twice daily with food for 7 days for treatment of your upper respiratory infection.  Use over-the-counter Tylenol and/or ibuprofen according the pack instructions as needed for any fever or pain.  Use the albuterol inhaler, with the spacer, and you may take 1 to 2 puffs every 4-6 hours as needed for shortness of breath or wheezing.  Use the Atrovent nasal spray, 2 squirts in each nostril every 6 hours, as needed for runny nose and postnasal drip.  Use the Tessalon Perles every 8 hours during the day.  Take them with a small sip of water.  They may give you some numbness to the  base of your tongue or a metallic taste in your mouth, this is normal.  Use the Promethazine DM cough syrup at bedtime for cough and congestion.  It will make you drowsy so do not take it during the day.  Return for reevaluation or see your primary care provider for any new or worsening symptoms.      ED Prescriptions     Medication Sig Dispense Auth. Provider   Spacer/Aero-Holding Chambers (AEROCHAMBER MV) inhaler Use as instructed 1 each Becky Augusta, NP   albuterol (VENTOLIN HFA) 108 (90 Base) MCG/ACT inhaler Inhale 2 puffs into the lungs every 4 (four) hours as needed. 18 g Becky Augusta, NP   amoxicillin-clavulanate (AUGMENTIN) 875-125 MG tablet Take 1 tablet by mouth every 12 (twelve) hours for 7 days. 14 tablet Becky Augusta, NP   benzonatate (TESSALON) 100 MG capsule Take 2 capsules (200 mg total) by mouth every 8 (eight) hours. 21 capsule Becky Augusta, NP   ipratropium (ATROVENT) 0.06 % nasal spray Place 2 sprays into both nostrils 4 (four) times daily. 15 mL Becky Augusta, NP   promethazine-dextromethorphan (PROMETHAZINE-DM) 6.25-15 MG/5ML syrup Take 5 mLs by mouth 4 (four) times daily as needed. 118 mL Becky Augusta, NP      PDMP not reviewed this encounter.   Becky Augusta, NP 07/03/23 1332    Becky Augusta, NP 07/03/23 1547

## 2023-07-03 NOTE — Discharge Instructions (Addendum)
 Your chest x-ray did not show any evidence of pneumonia.  Your exam is consistent with an upper respiratory infection.  Due to the fact that you are having purulent sputum production a trial of antibiotics is warranted.  Take the Augmentin 875 mg twice daily with food for 7 days for treatment of your upper respiratory infection.  Use over-the-counter Tylenol and/or ibuprofen according the pack instructions as needed for any fever or pain.  Use the albuterol inhaler, with the spacer, and you may take 1 to 2 puffs every 4-6 hours as needed for shortness of breath or wheezing.  Use the Atrovent nasal spray, 2 squirts in each nostril every 6 hours, as needed for runny nose and postnasal drip.  Use the Tessalon Perles every 8 hours during the day.  Take them with a small sip of water.  They may give you some numbness to the base of your tongue or a metallic taste in your mouth, this is normal.  Use the Promethazine DM cough syrup at bedtime for cough and congestion.  It will make you drowsy so do not take it during the day.  Return for reevaluation or see your primary care provider for any new or worsening symptoms.

## 2023-07-11 DIAGNOSIS — L738 Other specified follicular disorders: Secondary | ICD-10-CM | POA: Diagnosis not present

## 2023-08-22 DIAGNOSIS — L570 Actinic keratosis: Secondary | ICD-10-CM | POA: Diagnosis not present

## 2023-08-22 DIAGNOSIS — L738 Other specified follicular disorders: Secondary | ICD-10-CM | POA: Diagnosis not present

## 2023-08-22 DIAGNOSIS — D492 Neoplasm of unspecified behavior of bone, soft tissue, and skin: Secondary | ICD-10-CM | POA: Diagnosis not present

## 2023-08-29 DIAGNOSIS — K219 Gastro-esophageal reflux disease without esophagitis: Secondary | ICD-10-CM | POA: Diagnosis not present

## 2023-08-29 DIAGNOSIS — G43109 Migraine with aura, not intractable, without status migrainosus: Secondary | ICD-10-CM | POA: Diagnosis not present

## 2023-08-29 DIAGNOSIS — E7800 Pure hypercholesterolemia, unspecified: Secondary | ICD-10-CM | POA: Diagnosis not present

## 2023-08-29 DIAGNOSIS — I10 Essential (primary) hypertension: Secondary | ICD-10-CM | POA: Diagnosis not present

## 2023-09-09 ENCOUNTER — Other Ambulatory Visit: Payer: Self-pay | Admitting: Family Medicine

## 2023-09-09 DIAGNOSIS — Z1231 Encounter for screening mammogram for malignant neoplasm of breast: Secondary | ICD-10-CM

## 2023-10-08 ENCOUNTER — Ambulatory Visit
Admission: RE | Admit: 2023-10-08 | Discharge: 2023-10-08 | Disposition: A | Discharge status: Home / Self Care | Source: Ambulatory Visit | Attending: Family Medicine | Admitting: Family Medicine

## 2023-10-08 DIAGNOSIS — Z1231 Encounter for screening mammogram for malignant neoplasm of breast: Secondary | ICD-10-CM | POA: Insufficient documentation

## 2023-10-14 ENCOUNTER — Other Ambulatory Visit: Payer: Self-pay | Admitting: Family Medicine

## 2023-10-14 DIAGNOSIS — R928 Other abnormal and inconclusive findings on diagnostic imaging of breast: Secondary | ICD-10-CM

## 2023-10-16 ENCOUNTER — Ambulatory Visit
Admission: RE | Admit: 2023-10-16 | Discharge: 2023-10-16 | Disposition: A | Discharge status: Home / Self Care | Source: Ambulatory Visit | Attending: Family Medicine | Admitting: Family Medicine

## 2023-10-16 DIAGNOSIS — R928 Other abnormal and inconclusive findings on diagnostic imaging of breast: Secondary | ICD-10-CM | POA: Insufficient documentation

## 2023-10-16 DIAGNOSIS — R92321 Mammographic fibroglandular density, right breast: Secondary | ICD-10-CM | POA: Diagnosis not present

## 2023-10-16 DIAGNOSIS — N6314 Unspecified lump in the right breast, lower inner quadrant: Secondary | ICD-10-CM | POA: Diagnosis not present

## 2023-10-21 ENCOUNTER — Other Ambulatory Visit: Payer: Self-pay | Admitting: Family Medicine

## 2023-10-21 ENCOUNTER — Inpatient Hospital Stay
Admission: RE | Admit: 2023-10-21 | Discharge: 2023-10-21 | Disposition: A | Discharge status: Home / Self Care | Source: Ambulatory Visit | Attending: Family Medicine | Admitting: Family Medicine

## 2023-10-21 ENCOUNTER — Ambulatory Visit
Admission: RE | Admit: 2023-10-21 | Discharge: 2023-10-21 | Disposition: A | Discharge status: Home / Self Care | Source: Ambulatory Visit | Attending: Family Medicine | Admitting: Family Medicine

## 2023-10-21 DIAGNOSIS — R928 Other abnormal and inconclusive findings on diagnostic imaging of breast: Secondary | ICD-10-CM

## 2023-10-21 DIAGNOSIS — N6001 Solitary cyst of right breast: Secondary | ICD-10-CM | POA: Diagnosis not present

## 2023-10-21 DIAGNOSIS — N6081 Other benign mammary dysplasias of right breast: Secondary | ICD-10-CM | POA: Diagnosis not present

## 2023-10-21 MED ORDER — LIDOCAINE 1 % OPTIME INJ - NO CHARGE
2.0000 mL | Freq: Once | INTRAMUSCULAR | Status: AC
  Administered 2023-10-21: 2 mL
  Filled 2023-10-21: qty 2

## 2023-10-21 MED ORDER — LIDOCAINE-EPINEPHRINE 1 %-1:100000 IJ SOLN
8.0000 mL | Freq: Once | INTRAMUSCULAR | Status: AC
  Administered 2023-10-21: 8 mL
  Filled 2023-10-21: qty 8

## 2023-10-22 LAB — CYTOLOGY - NON PAP

## 2023-11-25 DIAGNOSIS — L738 Other specified follicular disorders: Secondary | ICD-10-CM | POA: Diagnosis not present

## 2023-11-25 DIAGNOSIS — L720 Epidermal cyst: Secondary | ICD-10-CM | POA: Diagnosis not present

## 2024-01-01 ENCOUNTER — Encounter: Payer: Self-pay | Admitting: Emergency Medicine

## 2024-01-01 ENCOUNTER — Ambulatory Visit
Admission: EM | Admit: 2024-01-01 | Discharge: 2024-01-01 | Disposition: A | Discharge status: Home / Self Care | Attending: Emergency Medicine | Admitting: Emergency Medicine

## 2024-01-01 DIAGNOSIS — N390 Urinary tract infection, site not specified: Secondary | ICD-10-CM | POA: Insufficient documentation

## 2024-01-01 DIAGNOSIS — B3731 Acute candidiasis of vulva and vagina: Secondary | ICD-10-CM | POA: Diagnosis not present

## 2024-01-01 LAB — URINALYSIS, W/ REFLEX TO CULTURE (INFECTION SUSPECTED)
Bilirubin Urine: NEGATIVE
Glucose, UA: NEGATIVE mg/dL
Hgb urine dipstick: NEGATIVE
Ketones, ur: NEGATIVE mg/dL
Nitrite: NEGATIVE
Protein, ur: NEGATIVE mg/dL
Specific Gravity, Urine: 1.01 (ref 1.005–1.030)
pH: 5 (ref 5.0–8.0)

## 2024-01-01 MED ORDER — PHENAZOPYRIDINE HCL 200 MG PO TABS: 200.0000 mg | ORAL_TABLET | Freq: Three times a day (TID) | ORAL | 0 refills | Status: AC

## 2024-01-01 MED ORDER — FLUCONAZOLE 150 MG PO TABS: 150.0000 mg | ORAL_TABLET | ORAL | 0 refills | Status: AC

## 2024-01-01 MED ORDER — NITROFURANTOIN MONOHYD MACRO 100 MG PO CAPS: 100.0000 mg | ORAL_CAPSULE | Freq: Two times a day (BID) | ORAL | 0 refills | Status: AC

## 2024-01-01 NOTE — Discharge Instructions (Addendum)
 Take the Macrobid  twice daily for 5 days with food for treatment of urinary tract infection.  Use the Pyridium  every 8 hours as needed for urinary discomfort.  This will turn your urine a bright red-orange.  Increase your oral fluid intake so that you increase your urine production and or flushing your urinary system.  Take an over-the-counter probiotic, such as Culturelle-Align-Activia, 1 hour after each dose of antibiotic to prevent diarrhea or yeast infections from forming.  We will culture urine and change the antibiotics if necessary.  For the developing vaginal yeast infection take 1 Diflucan  tablet now and repeat dosing every 3 days for total of 3 doses.  Return for reevaluation, or see your primary care provider, for any new or worsening symptoms.

## 2024-01-01 NOTE — ED Triage Notes (Signed)
 Pt presents with dysuria and lower back pain x 2 weeks.

## 2024-01-01 NOTE — ED Provider Notes (Signed)
 [facility]    CSN: [id number] Arrival date & time: 01/01/24  1311      History   Chief Complaint Chief Complaint  Patient presents with   Dysuria   Back Pain    HPI Christina Jordan is a 73 y.o. female.   HPI  73 year old female with past medical history significant for chronic venous insufficiency, hypertension, hyperlipidemia, GERD, migraine headaches, rosacea, and vitamin D deficiency presents for evaluation of UTI symptoms that have been going on for the last 2 weeks.  She is endorsing dysuria with urgency and frequency.  Also some low back pain.  She has not seen the blood in her urine and she denies fever.  No vaginal discharge or itching.  Past Medical History:  Diagnosis Date   Complication of anesthesia    hard to wake after hysterectomy   GERD (gastroesophageal reflux disease)    Headache    High cholesterol    Hypertension    Rosacea     Patient Active Problem List   Diagnosis Date Noted   Chronic venous insufficiency 10/30/2021   Hypertension 01/11/2014   Hyperlipidemia 01/11/2014   Migraine 01/11/2014   Rosacea 01/11/2014   Esophageal reflux 10/27/2013   Vitamin D deficiency 10/27/2013    Past Surgical History:  Procedure Laterality Date   ABDOMINAL HYSTERECTOMY     BIOPSY  05/27/2023   Procedure: BIOPSY;  Surgeon: Maryruth Ole DASEN, MD;  Location: [facility];  Service: Endoscopy;;   CATARACT EXTRACTION W/PHACO Right 10/10/2018   Procedure: CATARACT EXTRACTION PHACO AND INTRAOCULAR LENS PLACEMENT (IOC)  RIGHT;  Surgeon: Myrna Adine Anes, MD;  Location: [facility];  Service: Ophthalmology;  Laterality: Right;   CATARACT EXTRACTION W/PHACO Left 11/07/2018   Procedure: CATARACT EXTRACTION PHACO AND INTRAOCULAR LENS PLACEMENT (IOC)  LEFT;  Surgeon: Myrna Adine Anes, MD;  Location: [facility];  Service: Ophthalmology;  Laterality: Left;   COLONOSCOPY     COLONOSCOPY WITH PROPOFOL  N/A 09/20/2017   Procedure: COLONOSCOPY  WITH PROPOFOL ;  Surgeon: Viktoria Lamar DASEN, MD;  Location: [facility];  Service: Endoscopy;  Laterality: N/A;   COLONOSCOPY WITH PROPOFOL  N/A 05/27/2023   Procedure: COLONOSCOPY WITH PROPOFOL ;  Surgeon: Maryruth Ole DASEN, MD;  Location: [facility];  Service: Endoscopy;  Laterality: N/A;   ESOPHAGOGASTRODUODENOSCOPY (EGD) WITH PROPOFOL  N/A 05/27/2023   Procedure: ESOPHAGOGASTRODUODENOSCOPY (EGD) WITH PROPOFOL ;  Surgeon: Maryruth Ole DASEN, MD;  Location: [facility];  Service: Endoscopy;  Laterality: N/A;   POLYPECTOMY  05/27/2023   Procedure: POLYPECTOMY;  Surgeon: Maryruth Ole DASEN, MD;  Location: [facility];  Service: Endoscopy;;   WISDOM TOOTH EXTRACTION      OB History   No obstetric history on file.      Home Medications    Prior to Admission medications   Medication Sig Start Date End Date Taking? Authorizing Provider  fluconazole  (DIFLUCAN ) 150 MG tablet Take 1 tablet (150 mg total) by mouth every 3 (three) days for 3 doses. 01/01/24 01/08/24 Yes Bernardino Ditch, NP  nitrofurantoin , macrocrystal-monohydrate, (MACROBID ) 100 MG capsule Take 1 capsule (100 mg total) by mouth 2 (two) times daily. 01/01/24  Yes Bernardino Ditch, NP  phenazopyridine  (PYRIDIUM ) 200 MG tablet Take 1 tablet (200 mg total) by mouth 3 (three) times daily. 01/01/24  Yes Bernardino Ditch, NP  albuterol  (VENTOLIN  HFA) 108 (90 Base) MCG/ACT inhaler Inhale 2 puffs into the lungs every 4 (four) hours as needed. 07/03/23   Bernardino Ditch, NP  atorvastatin (LIPITOR) 10 MG tablet Take 10 mg  by mouth daily.    [provider]  benzonatate  (TESSALON ) 100 MG capsule Take 2 capsules (200 mg total) by mouth every 8 (eight) hours. 07/03/23   Bernardino Ditch, NP  calcium-vitamin D (OSCAL WITH D) 500-200 MG-UNIT tablet Take 1 tablet by mouth daily.    [provider]  hydrochlorothiazide (HYDRODIURIL) 25 MG tablet Take 25 mg by mouth daily.    [provider]  ipratropium (ATROVENT ) 0.06 % nasal spray Place  2 sprays into both nostrils 4 (four) times daily. 07/03/23   Bernardino Ditch, NP  lisinopril (PRINIVIL,ZESTRIL) 10 MG tablet Take 10 mg by mouth daily.    [provider]  promethazine -dextromethorphan (PROMETHAZINE -DM) 6.25-15 MG/5ML syrup Take 5 mLs by mouth 4 (four) times daily as needed. 07/03/23   Bernardino Ditch, NP  Spacer/Aero-Holding Raguel (AEROCHAMBER MV) inhaler Use as instructed 07/03/23   Bernardino Ditch, NP    Family History Family History  Problem Relation Age of Onset   Breast cancer Neg Hx     Social History Social History   Tobacco Use   Smoking status: Former    Current packs/day: 0.00    Types: Cigarettes    Quit date: 05/02/1997    Years since quitting: 26.6   Smokeless tobacco: Never  Vaping Use   Vaping status: Never Used  Substance Use Topics   Alcohol use: No    Comment: may have drink 2-3x/yr   Drug use: No     Allergies   Codeine   Review of Systems Review of Systems  Constitutional:  Negative for fever.  Genitourinary:  Positive for dysuria, frequency and urgency. Negative for hematuria, vaginal discharge and vaginal pain.  Musculoskeletal:  Positive for back pain.     Physical Exam Triage Vital Signs ED Triage Vitals  Encounter Vitals Group     BP 01/01/24 1425 (!) 140/96     Girls Systolic BP Percentile --      Girls Diastolic BP Percentile --      Boys Systolic BP Percentile --      Boys Diastolic BP Percentile --      Pulse Rate 01/01/24 1425 83     Resp 01/01/24 1425 16     Temp 01/01/24 1425 98.2 F (36.8 C)     Temp Source 01/01/24 1425 Oral     SpO2 01/01/24 1425 100 %     Weight 01/01/24 1420 210 lb (95.3 kg)     Height --      Head Circumference --      Peak Flow --      Pain Score 01/01/24 1420 6     Pain Loc --      Pain Education --      Exclude from Growth Chart --    No data found.  Updated Vital Signs BP (!) 140/96 (BP Location: Left Arm)   Pulse 83   Temp 98.2 F (36.8 C) (Oral)   Resp 16   Wt 210  lb (95.3 kg)   SpO2 100%   BMI 32.89 kg/m   Visual Acuity Right Eye Distance:   Left Eye Distance:   Bilateral Distance:    Right Eye Near:   Left Eye Near:    Bilateral Near:     Physical Exam Vitals and nursing note reviewed.  Constitutional:      Appearance: Normal appearance. She is not ill-appearing.  HENT:     Head: Normocephalic and atraumatic.  Cardiovascular:     Rate and Rhythm: Normal  rate and regular rhythm.     Pulses: Normal pulses.     Heart sounds: Normal heart sounds. No murmur heard.    No friction rub. No gallop.  Pulmonary:     Effort: Pulmonary effort is normal.     Breath sounds: Normal breath sounds. No wheezing, rhonchi or rales.  Abdominal:     Tenderness: There is no right CVA tenderness or left CVA tenderness.  Skin:    General: Skin is warm and dry.     Capillary Refill: Capillary refill takes less than 2 seconds.     Findings: No rash.  Neurological:     General: No focal deficit present.     Mental Status: She is alert.      UC Treatments / Results  Labs (all labs ordered are listed, but only abnormal results are displayed) Labs Reviewed  URINALYSIS, W/ REFLEX TO CULTURE (INFECTION SUSPECTED) - Abnormal; Notable for the following components:      Result Value   Color, Urine STRAW (*)    APPearance HAZY (*)    Leukocytes,Ua MODERATE (*)    Bacteria, UA FEW (*)    All other components within normal limits  URINE CULTURE    EKG   Radiology No results found.  Procedures Procedures (including critical care time)  Medications Ordered in UC Medications - No data to display  Initial Impression / Assessment and Plan / UC Course  I have reviewed the triage vital signs and the nursing notes.  Pertinent labs & imaging results that were available during my care of the patient were reviewed by me and considered in my medical decision making (see chart for details).   Patient is a pleasant, nontoxic-appearing 73 year old female  presenting for evaluation of UTI symptoms outlined HPI above.  In the exam of the patient is in any acute distress.  Her lungs are to auscultation fields.  She has no CVA tenderness on exam.  Given that she is complaining of dysuria with urgency and frequency I will order a urinalysis to assess for the presence of UTI.  Urinalysis is strong color with a hazy appearance.  Moderate leukocyte esterase but negative for nitrates, protein, hemoglobin, or glucose.  Reflex microscopy shows 21-50 WBCs with few bacteria, WBC clumps, and budding yeast present.  Urine will reflex to culture.  I will discharge patient with diagnosis of UTI and will start her on Macrobid  100 mg twice daily for 5 days along with Pyridium  200 mg every 8 hours as needed for urinary discomfort.  Due to the budding yeast being present I will also start her on Diflucan  to treat her for developing yeast infection, 150 mg now and repeat dosing every 3 days for total of 3 doses.  Return precautions reviewed.   Final Clinical Impressions(s) / UC Diagnoses   Final diagnoses:  Lower urinary tract infectious disease  Vaginal yeast infection     Discharge Instructions      Take the Macrobid  twice daily for 5 days with food for treatment of urinary tract infection.  Use the Pyridium  every 8 hours as needed for urinary discomfort.  This will turn your urine a bright red-orange.  Increase your oral fluid intake so that you increase your urine production and or flushing your urinary system.  Take an over-the-counter probiotic, such as Culturelle-Align-Activia, 1 hour after each dose of antibiotic to prevent diarrhea or yeast infections from forming.  We will culture urine and change the antibiotics if necessary.  For the  developing vaginal yeast infection take 1 Diflucan  tablet now and repeat dosing every 3 days for total of 3 doses.  Return for reevaluation, or see your primary care provider, for any new or worsening symptoms.       ED Prescriptions     Medication Sig Dispense Auth. Provider   nitrofurantoin , macrocrystal-monohydrate, (MACROBID ) 100 MG capsule Take 1 capsule (100 mg total) by mouth 2 (two) times daily. 10 capsule Bernardino Ditch, NP   fluconazole  (DIFLUCAN ) 150 MG tablet Take 1 tablet (150 mg total) by mouth every 3 (three) days for 3 doses. 3 tablet Bernardino Ditch, NP   phenazopyridine  (PYRIDIUM ) 200 MG tablet Take 1 tablet (200 mg total) by mouth 3 (three) times daily. 6 tablet Bernardino Ditch, NP      PDMP not reviewed this encounter.   Bernardino Ditch, NP 01/01/24 [phone]

## 2024-01-03 ENCOUNTER — Ambulatory Visit (HOSPITAL_COMMUNITY): Payer: Self-pay

## 2024-01-03 LAB — URINE CULTURE

## 2024-02-04 DIAGNOSIS — H26492 Other secondary cataract, left eye: Secondary | ICD-10-CM | POA: Diagnosis not present

## 2024-02-04 DIAGNOSIS — Z961 Presence of intraocular lens: Secondary | ICD-10-CM | POA: Diagnosis not present

## 2024-02-04 DIAGNOSIS — H40003 Preglaucoma, unspecified, bilateral: Secondary | ICD-10-CM | POA: Diagnosis not present

## 2024-02-04 DIAGNOSIS — Z0100 Encounter for examination of eyes and vision without abnormal findings: Secondary | ICD-10-CM | POA: Diagnosis not present

## 2024-02-12 DIAGNOSIS — N952 Postmenopausal atrophic vaginitis: Secondary | ICD-10-CM | POA: Diagnosis not present

## 2024-02-12 DIAGNOSIS — B3731 Acute candidiasis of vulva and vagina: Secondary | ICD-10-CM | POA: Diagnosis not present

## 2024-02-12 DIAGNOSIS — R399 Unspecified symptoms and signs involving the genitourinary system: Secondary | ICD-10-CM | POA: Diagnosis not present

## 2024-02-12 DIAGNOSIS — N9489 Other specified conditions associated with female genital organs and menstrual cycle: Secondary | ICD-10-CM | POA: Diagnosis not present

## 2024-03-12 DIAGNOSIS — R300 Dysuria: Secondary | ICD-10-CM | POA: Diagnosis not present

## 2024-03-13 DIAGNOSIS — H26492 Other secondary cataract, left eye: Secondary | ICD-10-CM | POA: Diagnosis not present

## 2024-03-16 ENCOUNTER — Other Ambulatory Visit: Payer: Self-pay | Admitting: Family Medicine

## 2024-03-16 DIAGNOSIS — R1011 Right upper quadrant pain: Secondary | ICD-10-CM

## 2024-03-17 ENCOUNTER — Other Ambulatory Visit: Payer: Self-pay | Admitting: Family Medicine

## 2024-03-17 ENCOUNTER — Encounter: Payer: Self-pay | Admitting: Family Medicine

## 2024-03-17 DIAGNOSIS — R1011 Right upper quadrant pain: Secondary | ICD-10-CM

## 2024-03-23 ENCOUNTER — Ambulatory Visit
Admission: RE | Admit: 2024-03-23 | Discharge: 2024-03-23 | Disposition: A | Source: Ambulatory Visit | Attending: Family Medicine | Admitting: Family Medicine

## 2024-03-23 DIAGNOSIS — K824 Cholesterolosis of gallbladder: Secondary | ICD-10-CM | POA: Diagnosis not present

## 2024-03-23 DIAGNOSIS — R1011 Right upper quadrant pain: Secondary | ICD-10-CM | POA: Insufficient documentation
